# Patient Record
Sex: Male | Born: 1955 | Race: White | Hispanic: No | Marital: Married | Smoking: Never smoker
Health system: Southern US, Community
[De-identification: ages and names within clinical notes are randomized; demographics above are authoritative.]

## PROBLEM LIST (undated history)

## (undated) DIAGNOSIS — K219 Gastro-esophageal reflux disease without esophagitis: Secondary | ICD-10-CM

## (undated) DIAGNOSIS — K759 Inflammatory liver disease, unspecified: Secondary | ICD-10-CM

## (undated) HISTORY — PX: KNEE SURGERY: SHX244

## (undated) HISTORY — DX: Gastro-esophageal reflux disease without esophagitis: K21.9

## (undated) HISTORY — PX: CHOLECYSTECTOMY: SHX55

## (undated) HISTORY — DX: Inflammatory liver disease, unspecified: K75.9

## (undated) HISTORY — PX: COLONOSCOPY W/ BIOPSIES: SHX1374

---

## 1997-12-17 ENCOUNTER — Other Ambulatory Visit: Admission: RE | Admit: 1997-12-17 | Discharge: 1997-12-17 | Payer: Self-pay | Admitting: Gastroenterology

## 1999-08-21 ENCOUNTER — Emergency Department (HOSPITAL_COMMUNITY): Admission: EM | Admit: 1999-08-21 | Discharge: 1999-08-22 | Payer: Self-pay | Admitting: Emergency Medicine

## 1999-08-22 ENCOUNTER — Encounter: Payer: Self-pay | Admitting: Internal Medicine

## 1999-08-22 ENCOUNTER — Inpatient Hospital Stay (HOSPITAL_COMMUNITY): Admission: EM | Admit: 1999-08-22 | Discharge: 1999-08-24 | Payer: Self-pay | Admitting: Internal Medicine

## 2000-01-12 ENCOUNTER — Other Ambulatory Visit: Admission: RE | Admit: 2000-01-12 | Discharge: 2000-01-12 | Payer: Self-pay | Admitting: Gastroenterology

## 2000-01-12 ENCOUNTER — Encounter (INDEPENDENT_AMBULATORY_CARE_PROVIDER_SITE_OTHER): Payer: Self-pay | Admitting: Specialist

## 2004-09-20 ENCOUNTER — Observation Stay (HOSPITAL_COMMUNITY): Admission: EM | Admit: 2004-09-20 | Discharge: 2004-09-20 | Payer: Self-pay | Admitting: Emergency Medicine

## 2004-09-20 ENCOUNTER — Encounter (INDEPENDENT_AMBULATORY_CARE_PROVIDER_SITE_OTHER): Payer: Self-pay | Admitting: Specialist

## 2004-10-25 ENCOUNTER — Inpatient Hospital Stay (HOSPITAL_COMMUNITY): Admission: EM | Admit: 2004-10-25 | Discharge: 2004-10-27 | Payer: Self-pay | Admitting: Emergency Medicine

## 2004-10-25 ENCOUNTER — Ambulatory Visit: Payer: Self-pay | Admitting: Internal Medicine

## 2004-11-11 ENCOUNTER — Ambulatory Visit: Payer: Self-pay | Admitting: Gastroenterology

## 2004-12-21 ENCOUNTER — Emergency Department (HOSPITAL_COMMUNITY): Admission: EM | Admit: 2004-12-21 | Discharge: 2004-12-21 | Payer: Self-pay | Admitting: *Deleted

## 2005-01-07 ENCOUNTER — Ambulatory Visit: Payer: Self-pay | Admitting: Cardiology

## 2005-01-15 ENCOUNTER — Ambulatory Visit: Payer: Self-pay

## 2005-02-05 ENCOUNTER — Ambulatory Visit: Payer: Self-pay | Admitting: Internal Medicine

## 2005-11-07 IMAGING — CT CT PELVIS W/O CM
2 of 3 series · 17 of 46 positions shown, 19 images · non-contrast
Comparison: KUB on 10/25/04 and prior CT abdomen and pelvis on 09/20/04 and an intraoperative cholangiogram on 09/20/04.  
 ABDOMEN CT WITHOUT CONTRAST:

11/02/04 ? DUPLICATE COPY for exam association in RIS ? No change from original report.
CLINICAL DATA: 49-year-old with back and abdominal pain.  Flank pain bilaterally.  History of cholecystectomy five weeks ago.  Question of stone on KUB.
TECHNIQUE: Multidetector CT imaging of the abdomen was performed following the standard protocol without IV contrast.  
 Lung windows show no nodule, pleural effusion or infiltrate.  The patient has had cholecystectomy.  There is a stone measuring 4 mm at the region of the ampulla.  The findings are suspicious for obstructing distal common bile duct stone.  Surgical clips are seen in the gallbladder fossa, which is otherwise unremarkable.  The common bile duct is 11 mm in diameter.  
 No focal abnormality is seen within the liver, spleen, pancreas, adrenal glands or kidneys.  No retroperitoneal adenopathy or ascites.  No free intraperitoneal air or evidence for inflammation.
TECHNIQUE: Multidetector CT imaging of the pelvis was performed following the standard protocol without IV contrast. 
 The appendix has normal appearance.  Pelvic bowel loops have a normal appearance.  No pelvic adenopathy or free pelvic fluid.

[Series 2: renal stone · axial · 0.84mm/px · z∈[-500,-95]mm · 14 of 95 slices shown, 16 images]
[im 7/95  soft-tissue]
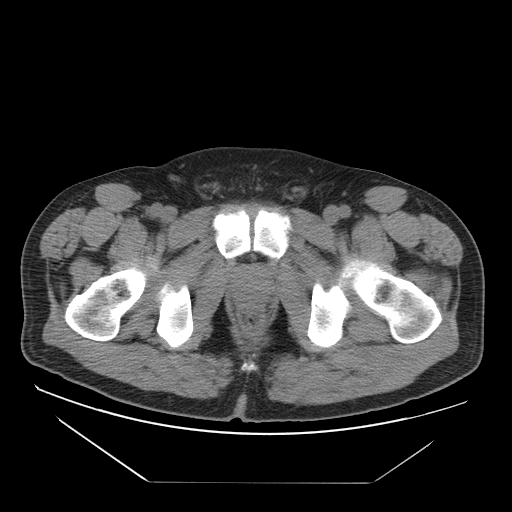
[im 7/95  bone]
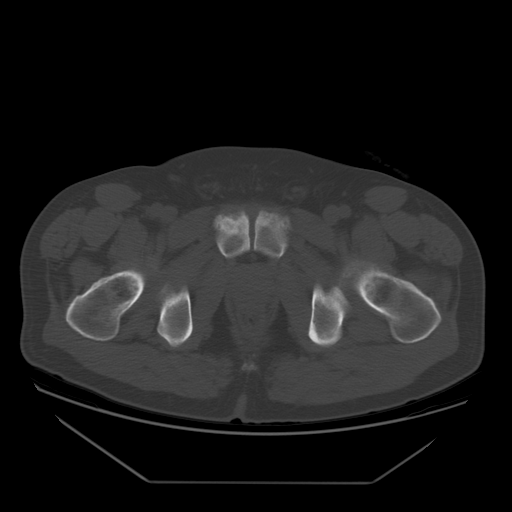
[im 13/95  soft-tissue]
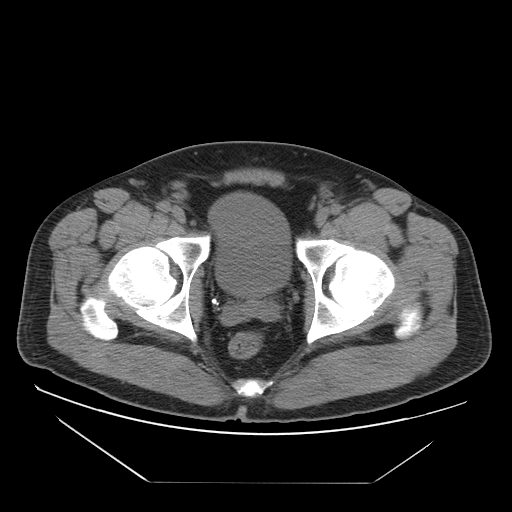
[im 19/95  soft-tissue]
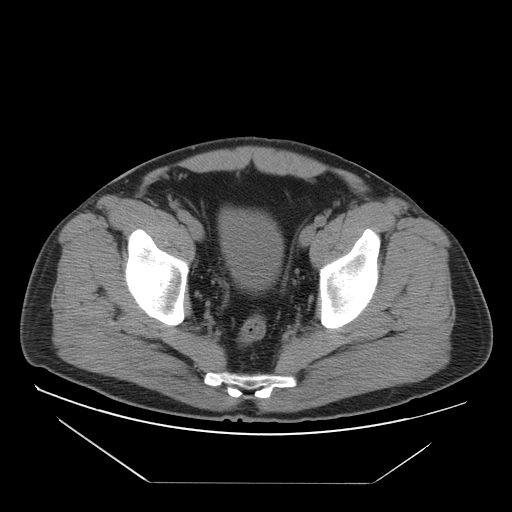
[im 25/95  soft-tissue]
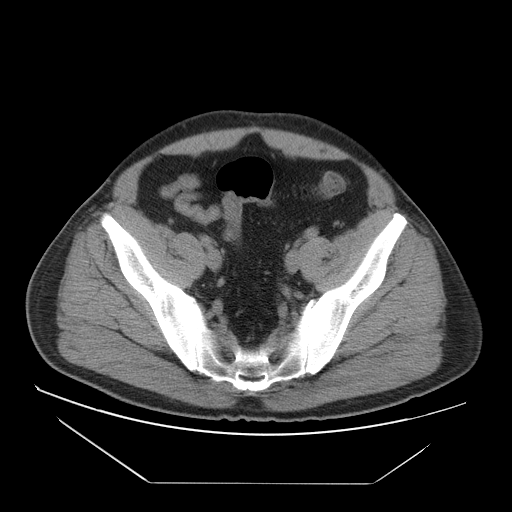
[im 31/95  soft-tissue]
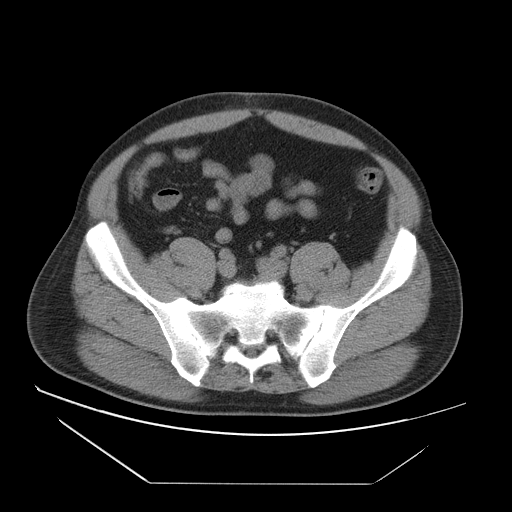
[im 37/95  soft-tissue]
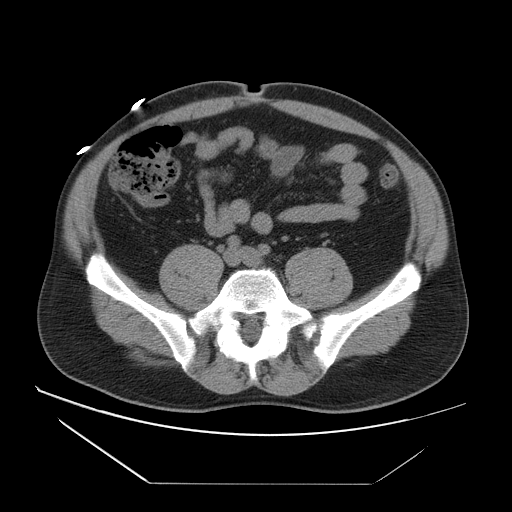
[im 43/95  soft-tissue]
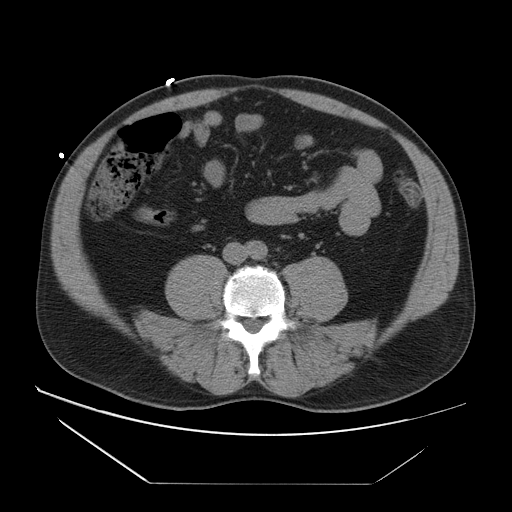
[im 52/95  soft-tissue]
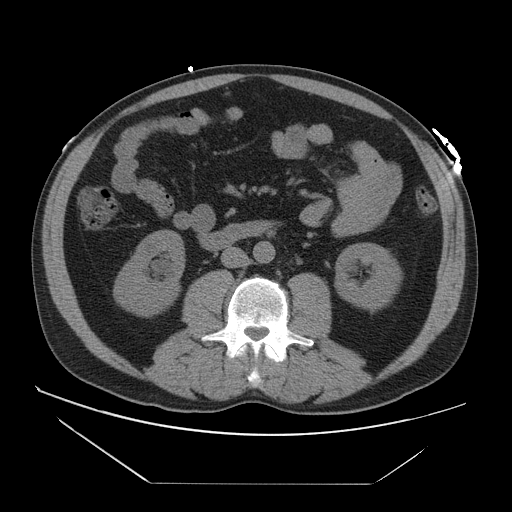
[im 58/95  soft-tissue]
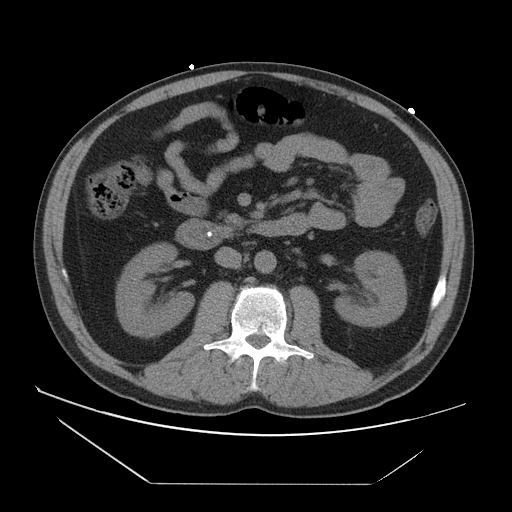
[im 58/95  bone]
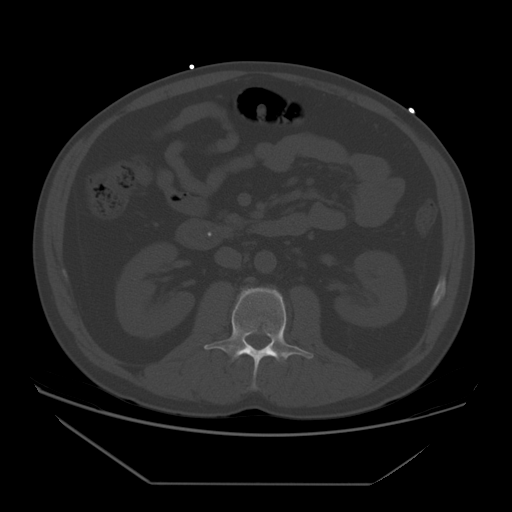
[im 64/95  soft-tissue]
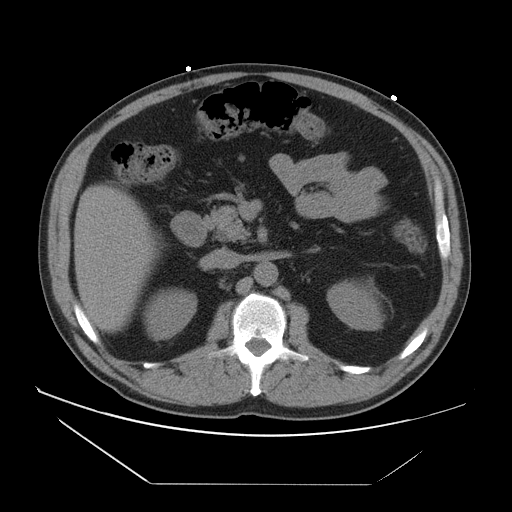
[im 70/95  soft-tissue]
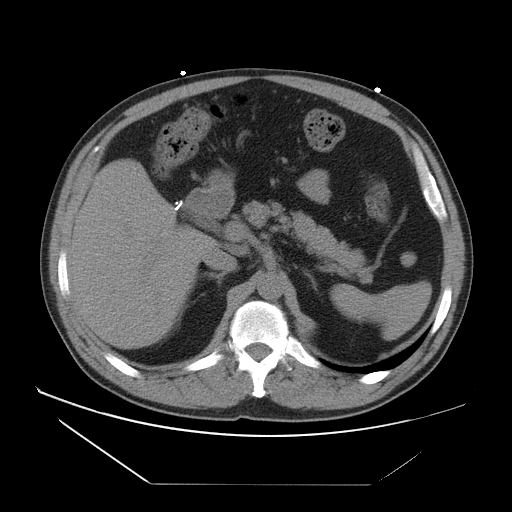
[im 76/95  soft-tissue]
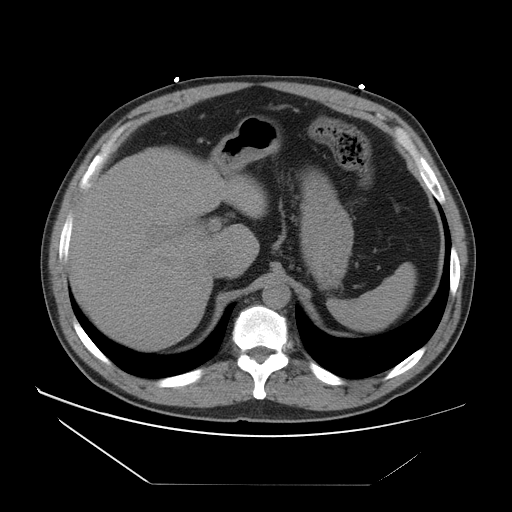
[im 82/95  soft-tissue]
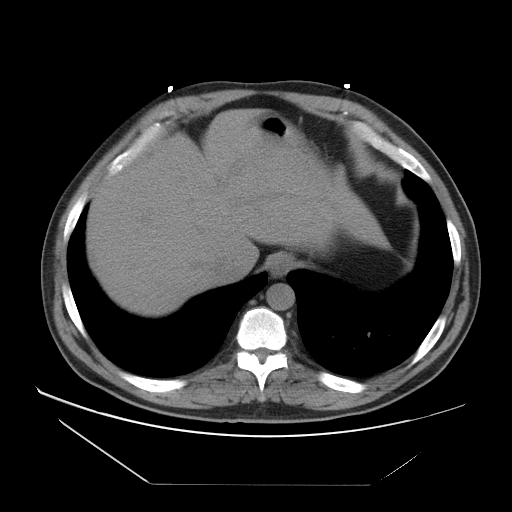
[im 88/95  soft-tissue]
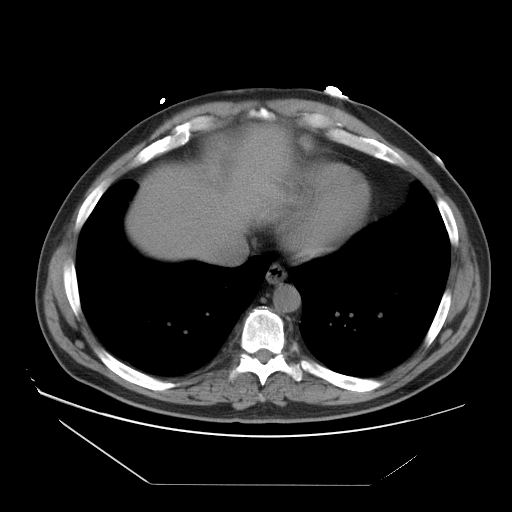

[Series 104: reformatted · sagittal · 0.94mm/px · 3 of 71 slices shown]
[im 24/71  soft-tissue]
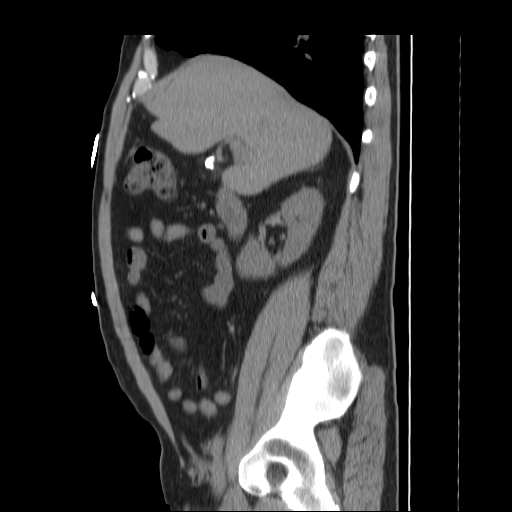
[im 32/71  soft-tissue]
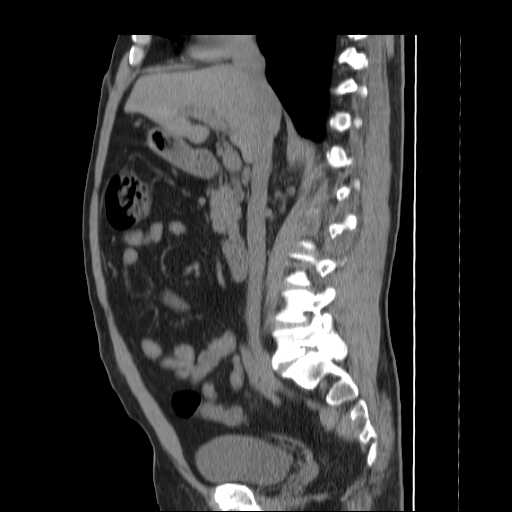
[im 39/71  soft-tissue]
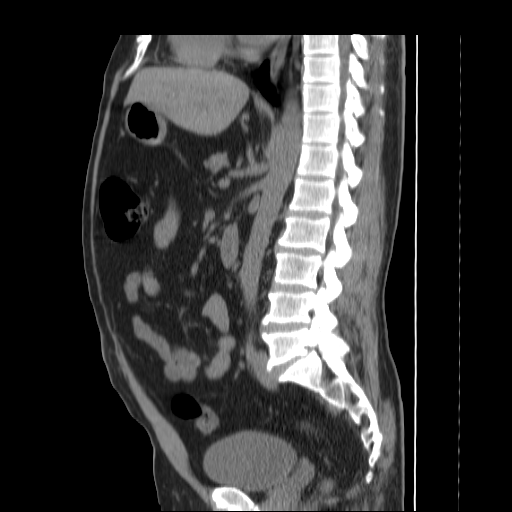

[17 of 46 positions shown; findings below may reference images not displayed]

IMPRESSION: Suspect distal common bile duct stone at the level of the ampulla.  
 PELVIS CT WITHOUT CONTRAST:
IMPRESSION: No evidence for acute pelvic abnormality.

## 2005-11-07 IMAGING — CR DG ABDOMEN 2V
3 series · 3 of 3 positions shown · non-contrast
Comparison: 09/20/2004

CLINICAL DATA: Abdominal pain, back pain

ABDOMEN - 2 VIEW

[w abdomen upright]
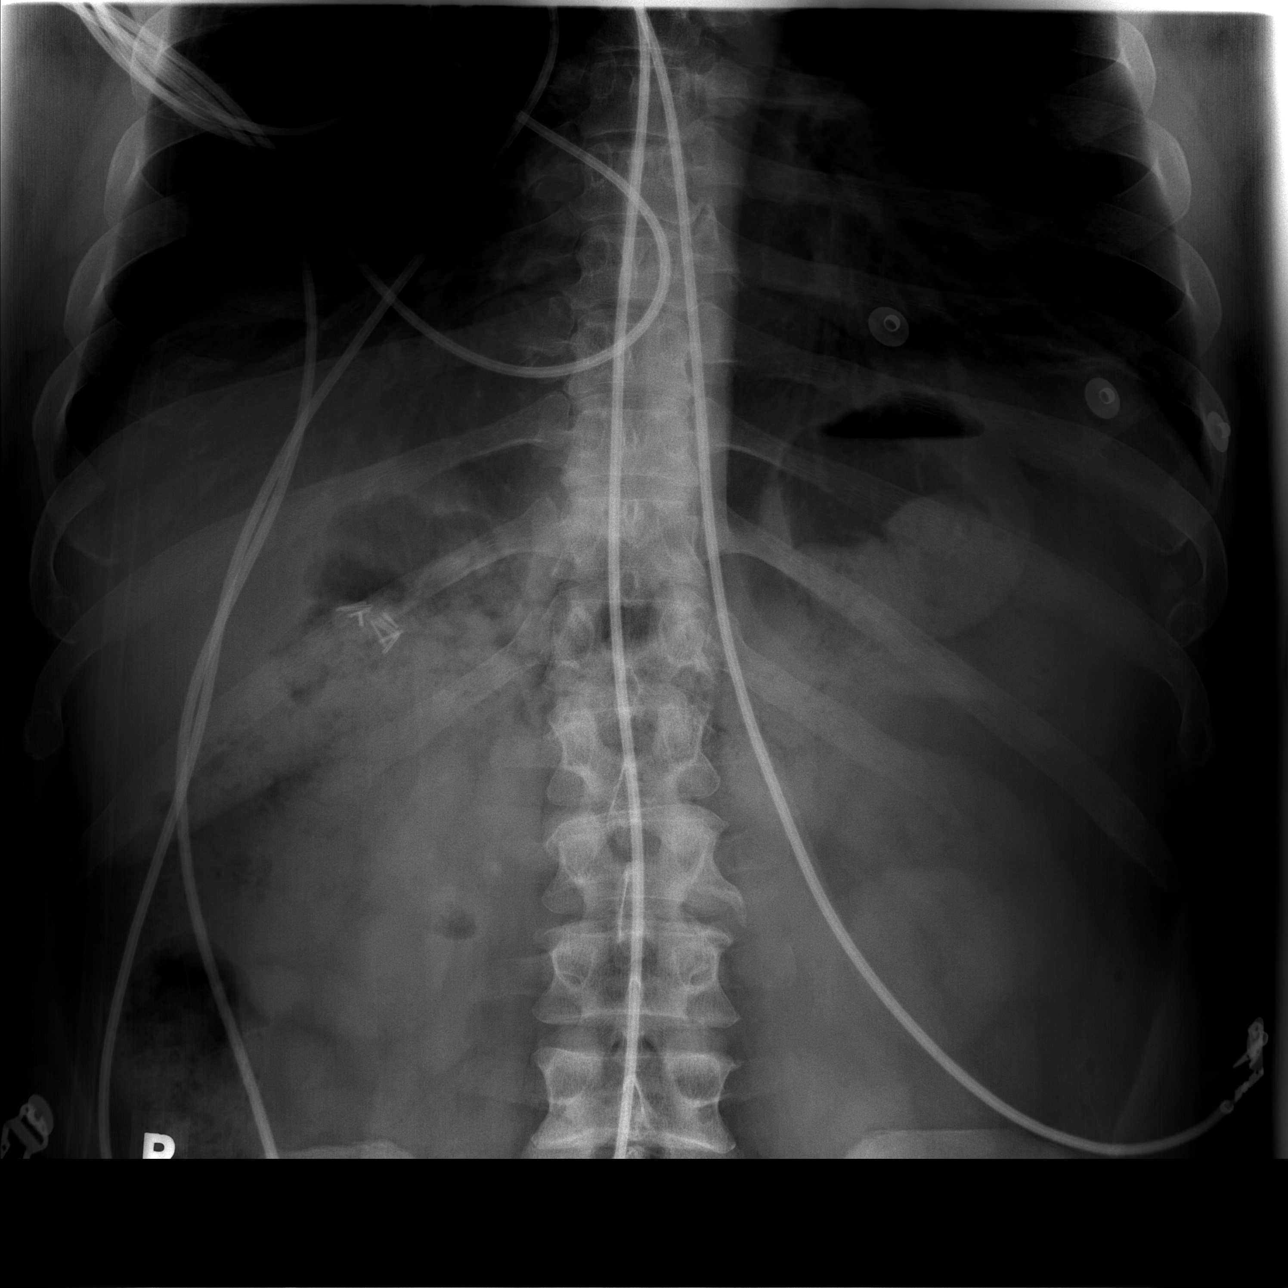

[t abdomen supine (1 of 2)]
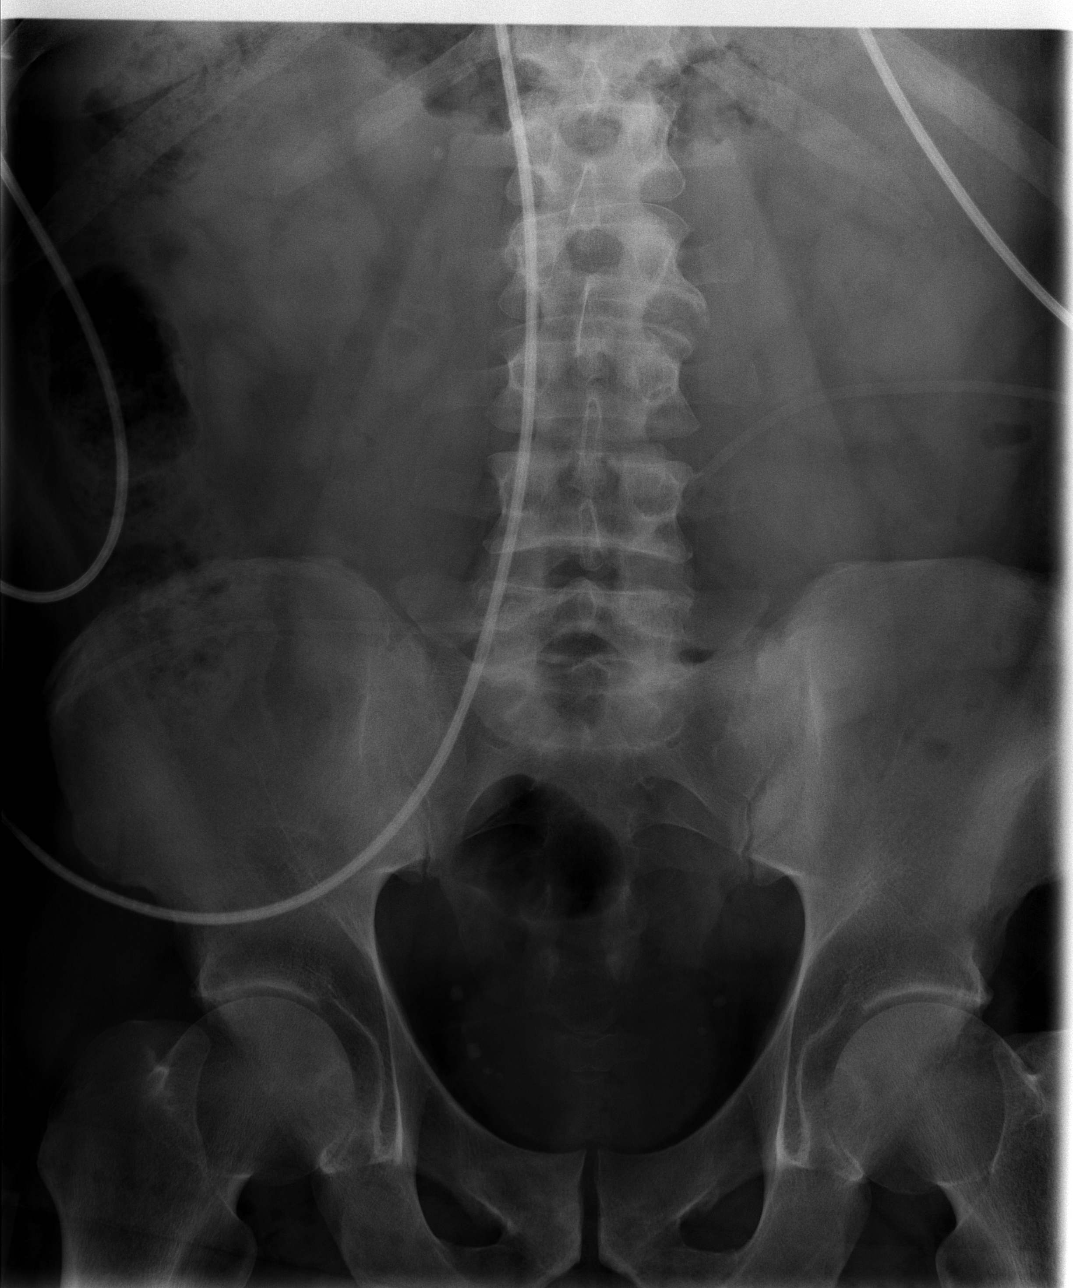

[t abdomen supine (2 of 2)]
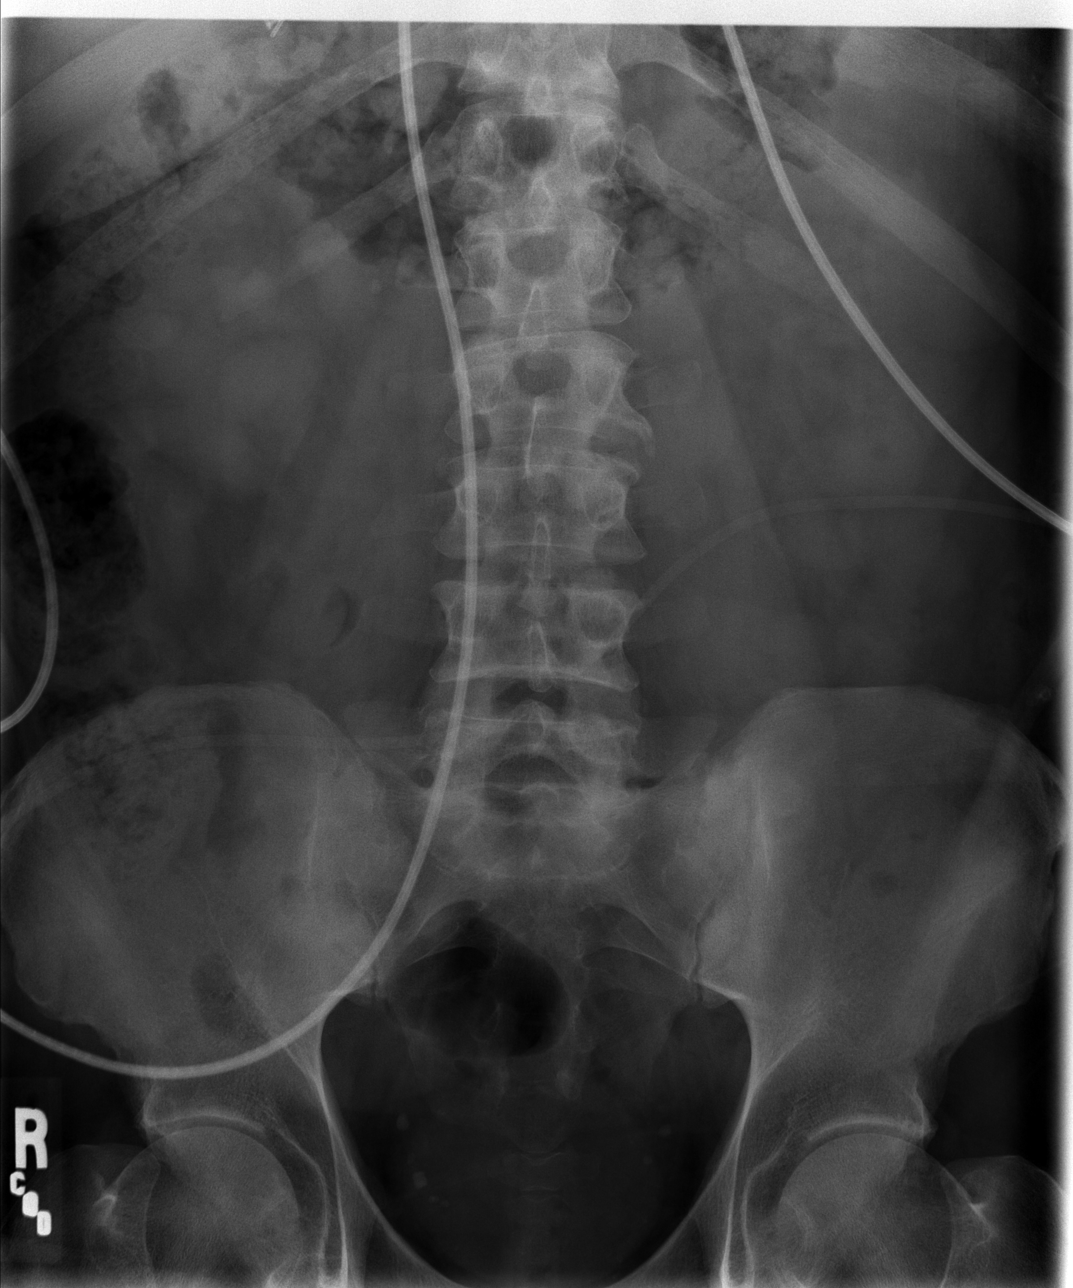

[3 of 3 positions shown; findings below may reference images not displayed]

FINDINGS: Severe constipation. Patient status post cholecystectomy. No evidence
of obstruction or free air.

There is a small calcification tracking out of the right L2 transverse process.
There is clinical concern for urinary tract stones, recommend CT.

IMPRESSION

Severe constipation. Status post cholecystectomy.

Small calcification adjacent to the right L2 transverse process. If there is
clinical concern for possible right urinary tract stone, CT may be helpful.

## 2005-11-07 IMAGING — CR DG CHEST 1V PORT
1 series · 1 of 1 positions shown · non-contrast
Comparison: None.

CLINICAL DATA: 49-year-old with back and abdominal pain. 
 PORTABLE CHEST:

[view not recorded]
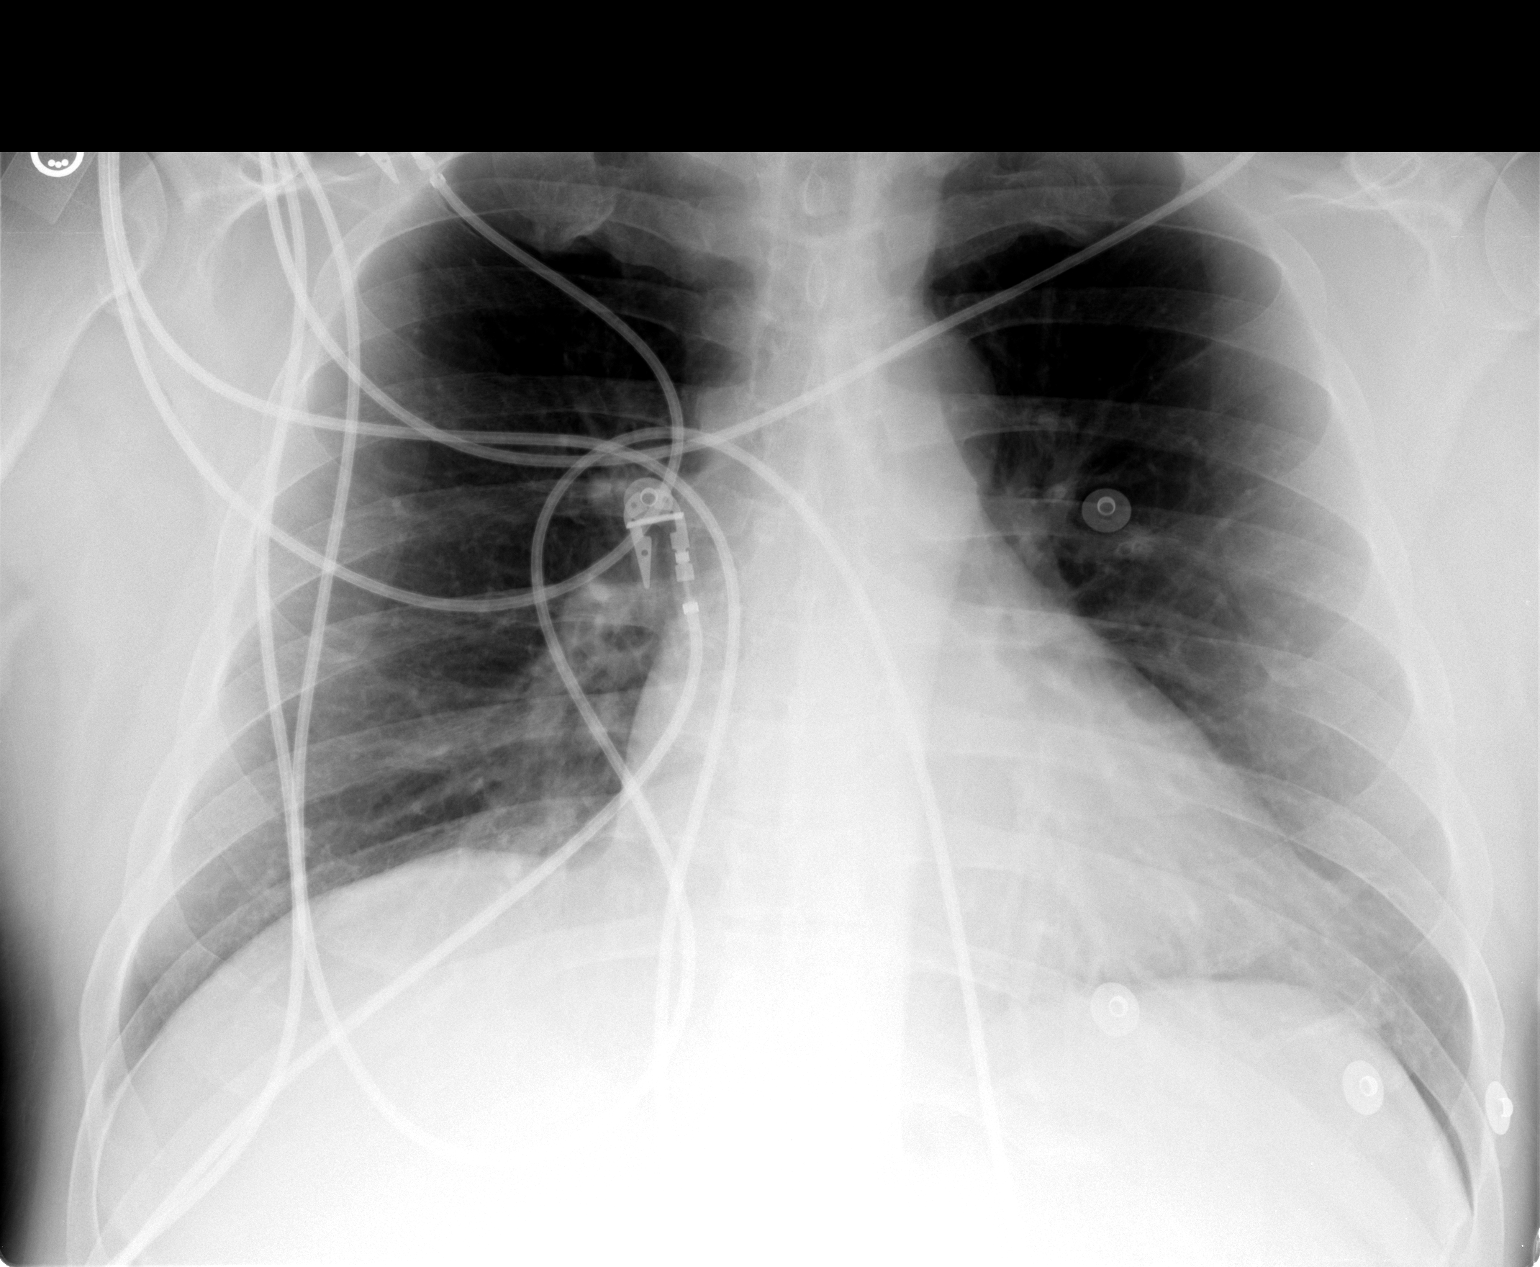

[1 of 1 positions shown; findings below may reference images not displayed]

FINDINGS: There are lines and leads overlying the chest, which could obscure subtle findings.  Heart size is within normal limits, given projection of the film.  Lungs are clear.
IMPRESSION: No acute cardiopulmonary findings.

## 2006-05-20 ENCOUNTER — Ambulatory Visit: Payer: Self-pay | Admitting: Gastroenterology

## 2006-06-02 ENCOUNTER — Encounter (INDEPENDENT_AMBULATORY_CARE_PROVIDER_SITE_OTHER): Payer: Self-pay | Admitting: Specialist

## 2006-06-02 ENCOUNTER — Encounter (INDEPENDENT_AMBULATORY_CARE_PROVIDER_SITE_OTHER): Payer: Self-pay | Admitting: *Deleted

## 2006-06-02 ENCOUNTER — Ambulatory Visit: Payer: Self-pay | Admitting: Gastroenterology

## 2006-06-20 ENCOUNTER — Ambulatory Visit: Payer: Self-pay | Admitting: Gastroenterology

## 2006-06-20 LAB — CONVERTED CEMR LAB
Alkaline Phosphatase: 62 units/L (ref 39–117)
Basophils Absolute: 0.2 10*3/uL — ABNORMAL HIGH (ref 0.0–0.1)
Basophils Relative: 2.2 % — ABNORMAL HIGH (ref 0.0–1.0)
Folate: 4.3 ng/mL
GFR calc Af Amer: 115 mL/min
HCT: 39.4 % (ref 39.0–52.0)
Monocytes Absolute: 0.8 10*3/uL — ABNORMAL HIGH (ref 0.2–0.7)
Monocytes Relative: 7.5 % (ref 3.0–11.0)
Neutrophils Relative %: 55.9 % (ref 43.0–77.0)
Platelets: 313 10*3/uL (ref 150–400)
Potassium: 4.2 meq/L (ref 3.5–5.1)
RDW: 12.9 % (ref 11.5–14.6)
Total Protein: 6.8 g/dL (ref 6.0–8.3)

## 2006-07-07 DIAGNOSIS — K5100 Ulcerative (chronic) pancolitis without complications: Secondary | ICD-10-CM

## 2007-02-22 ENCOUNTER — Ambulatory Visit: Payer: Self-pay | Admitting: Internal Medicine

## 2007-02-22 LAB — CONVERTED CEMR LAB
Basophils Relative: 3.1 % — ABNORMAL HIGH (ref 0.0–1.0)
Eosinophils Absolute: 0.4 10*3/uL (ref 0.0–0.6)
Eosinophils Relative: 3 % (ref 0.0–5.0)
Lymphocytes Relative: 22.6 % (ref 12.0–46.0)
MCHC: 35.1 g/dL (ref 30.0–36.0)
Neutrophils Relative %: 62.9 % (ref 43.0–77.0)
Platelets: 376 10*3/uL (ref 150–400)
RBC: 4.12 M/uL — ABNORMAL LOW (ref 4.22–5.81)
RDW: 13.8 % (ref 11.5–14.6)

## 2007-03-31 ENCOUNTER — Ambulatory Visit: Payer: Self-pay | Admitting: Family Medicine

## 2007-06-27 ENCOUNTER — Encounter: Payer: Self-pay | Admitting: Internal Medicine

## 2008-05-23 ENCOUNTER — Encounter (INDEPENDENT_AMBULATORY_CARE_PROVIDER_SITE_OTHER): Payer: Self-pay | Admitting: *Deleted

## 2009-01-17 ENCOUNTER — Encounter (INDEPENDENT_AMBULATORY_CARE_PROVIDER_SITE_OTHER): Payer: Self-pay | Admitting: *Deleted

## 2009-01-17 ENCOUNTER — Ambulatory Visit: Payer: Self-pay | Admitting: Internal Medicine

## 2009-01-17 DIAGNOSIS — E559 Vitamin D deficiency, unspecified: Secondary | ICD-10-CM

## 2009-01-19 LAB — CONVERTED CEMR LAB
AST: 25 units/L (ref 0–37)
Alkaline Phosphatase: 74 units/L (ref 39–117)
BUN: 20 mg/dL (ref 6–23)
Basophils Absolute: 0.1 10*3/uL (ref 0.0–0.1)
Calcium: 9.7 mg/dL (ref 8.4–10.5)
Chloride: 106 meq/L (ref 96–112)
Creatinine, Ser: 1.4 mg/dL (ref 0.4–1.5)
GFR calc non Af Amer: 56.46 mL/min (ref >60)
HCT: 44.3 % (ref 39.0–52.0)
Hemoglobin: 15 g/dL (ref 13.0–17.0)
Neutro Abs: 7 10*3/uL (ref 1.4–7.7)
Potassium: 4.3 meq/L (ref 3.5–5.1)
RBC: 4.49 M/uL (ref 4.22–5.81)
WBC: 11.1 10*3/uL — ABNORMAL HIGH (ref 4.5–10.5)

## 2009-01-20 LAB — CONVERTED CEMR LAB: Vit D, 25-Hydroxy: 15 ng/mL — ABNORMAL LOW (ref 30–89)

## 2009-03-20 ENCOUNTER — Ambulatory Visit: Payer: Self-pay | Admitting: Internal Medicine

## 2009-03-25 ENCOUNTER — Encounter: Payer: Self-pay | Admitting: Internal Medicine

## 2010-03-12 NOTE — Procedures (Signed)
Summary: Colonoscopy  Patient: [name] Note: All result statuses are Final unless otherwise noted.  Tests: (1) Colonoscopy (COL)   COL Colonoscopy           [facility] [employer]     [place], [place]  [address]           COLONOSCOPY PROCEDURE REPORT           PATIENT:  Isaiah, Miller  MR#:  [id number]     BIRTHDATE:  04-12-1955, 53 yrs. old  GENDER:  male           ENDOSCOPIST:  Gatha Mayer, MD, [facility]     Referred by:           PROCEDURE DATE:  03/20/2009     PROCEDURE:  Colonoscopy with biopsy     ASA CLASS:  Class I     INDICATIONS:  evaluation of ulcerative colitis, surveillance last     examination 2008     extent of disease not clear from that report but has been     characterized as left-sided colitis           MEDICATIONS:   Fentanyl 50 mcg IV, Versed 7 mg IV           DESCRIPTION OF PROCEDURE:   After the risks benefits and     alternatives of the procedure were thoroughly explained, informed     consent was obtained.  Digital rectal exam was performed and     revealed no abnormalities and normal prostate.   The LB CF-[id number]     [id number] endoscope was introduced through the anus and advanced to     the cecum, which was identified by both the appendix and ileocecal     valve, without limitations.  The quality of the prep was good,     using MoviPrep.  The instrument was then slowly withdrawn as the     colon was fully examined.     Insertion: 1:29 minutes Withdrawal: 13:52 minutes     <<PROCEDUREIMAGES>>           FINDINGS:  Colitis was found in the mid transverse colon. Multiple     aphthous ulcers - tiny.  This was otherwise a normal examination     of the colon. No colitis seen elsewhere in the colon. Random     biopsies were obtained and sent to pathology. Surveillance     biopsies taken and segregated to cecum-hepatic flexure, transverse     colon, splenic flexure and decsending colon, sigmoid and rectum.     Retroflexed views  in the rectum revealed no abnormalities.    The     scope was then withdrawn from the patient and the procedure     completed.           COMPLICATIONS:  None           ENDOSCOPIC IMPRESSION:     1) Colitis in the mid transverse colon     2) Otherwise normal examination - no active endoscopic colitis     elsewhere but surveillance biopsies taken throughout     RECOMMENDATIONS:     1) continue current medications     2) Await biopsy results     3) Will address low vitamin D and additional supplementation     when I review pathology           REPEAT EXAM:  In for Colonoscopy. likely in 2 years           Gatha Mayer, MD, [facility]           CC:  The Patient           n.     eSIGNED:   Gatha Mayer at 03/20/2009 09:37 AM           Dartha Lodge, [id number]  Note: An exclamation mark (!) indicates a result that was not dispersed into the flowsheet. Document Creation Date: 03/20/2009 9:38 AM _______________________________________________________________________  (1) Order result status: Final Collection or observation date-time: 03/20/2009 09:23 Requested date-time:  Receipt date-time:  Reported date-time:  Referring Physician:   Ordering Physician: Silvano Rusk [phone]) Specimen Source:  Source: Tawanna Cooler Order Number: [id number] Lab site:   Appended Document: Colonoscopy     Procedures Next Due Date:    Colonoscopy: 03/2011

## 2010-03-12 NOTE — Letter (Signed)
Summary: Patient Notice- Colon Biospy Results  [facility]  [address] [place], [place] [address]   Phone: [phone]  Fax: [phone]        March 25, 2009 MRN: [id number]    Gabrial Khiev [address] [place], [place]  [address]    Dear Mr. Mccort,  I am pleased to inform you that the biopsies taken during your recent colonoscopy did not show any evidence of cancer or pre-cancerous changes known as dysplasia upon pathologic examination. there were some areas of inflammation (colitis) but overall, your colitis is under good control.  Continue with the treatment plan (your current medications)as outlined on the day of your exam.  You should have a repeat colonoscopy examination for this problem           in 2 years.  You will need a routine  office visit within 1 year.  Please call us if you are having persistent problems or have questions about your condition that have not been fully answered at this time.  Sincerely,  Gatha Mayer MD, [facility]   This letter has been electronically signed by your physician.  Appended Document: Patient Notice- Colon Biospy Results Letter mailed 2.18.11

## 2010-06-03 ENCOUNTER — Other Ambulatory Visit: Payer: Self-pay | Admitting: Internal Medicine

## 2010-06-23 NOTE — Assessment & Plan Note (Signed)
[facility] OFFICE NOTE   CHARBEL, LOS                   MRN:          [id number]  DATE:06/20/2006                            DOB:          July 30, 1955    Nechemia comes in after his colonoscopic examination which revealed mild  to moderate colitis involving his left and transverse colon areas.  Right colon was not affected visually or by biopsy.  I had a long talk  with Kahlin about his compliance with the sulfasalazine.  He said he  gets it for $5 and therefore, he had stopped the Lialda which would be a  lot more expensive ultimately.  I certainly do understand that.  He says  he only takes the sulfasalazine some times just four pills a day and I  told him that I thought it was important that he keep his inflammatory  process under as much control as possible because I think the incidence  of cancer arises in patient's who have chronic inflammation in their  colons.  I think it is better to keep it as quiescent as possible.  He  understands this.  I do not know if he will comply.  I told him to  follow up with one of my colleagues in my absence and I did order some  routine labs on him.  I ordered some routine labs since we checked and  it had not been ordered in the recent past.  I also advised him to make  sure he uses multivitamins, folate and some B12 because sometimes the  sulfasalazine counteracts this.     Clarene Reamer, MD  Electronically Signed    SML/MedQ  DD: 06/20/2006  DT: 06/21/2006  Job #: [id number]   cc:   Kathlene November, MD

## 2010-06-23 NOTE — Assessment & Plan Note (Signed)
[facility]                         GASTROENTEROLOGY OFFICE NOTE   Isaiah, Miller                   MRN:          [id number]  DATE:02/22/2007                            DOB:          Mar 12, 1955    CHIEF COMPLAINT:  Ulcerative colitis flare.   HISTORY:  Isaiah Miller is a 55 year old white man with ulcerative colitis,  previously followed by Dr. Velora Heckler.  His last colonoscopy is April 2008,  showing left-sided ulcerative colitis.  He has been maintained on  sulfasalazine, but admits he does not take it as directed frequently.  Recently, he had some bloating, gas, loose bowel movements, some crampy  abdominal pain.  He had some prednisone left over, started at 30 a day  and tapered himself down to 10 mg a day, over 7 to 8 days and felt  better, but he is now out of prednisone.  His bowel movements are on the  way back to normal.  There has been no bleeding, though there has been  some mucus.  He occasionally uses Advil, but he says not lately.  He was  diagnosed with ulcerative colitis in 1988.   MEDICATIONS:  At this time, are sulfasalazine.  He is supposed to be on  500 mg, four twice a day he tells me, though three twice a day is what  is listed.  He says he often takes it four a day but not twice a day.  He uses Zantac and Advil p.r.n. as described.   ALLERGIES:  THERE ARE NO KNOWN DRUG ALLERGIES.   PAST MEDICAL HISTORY:  1. Ulcerative colitis, left-sided.  Biopsy showed that in the left      side he had mild disease in the left side at endoscopic evaluation      April 2008.  Last lab testing May 2008:  Normal CBC and CMET.      Vitamin B12 level, folate were normal at that time as well.  He has      been on Lialda, but it was too expensive.  He has taken prednisone      intermittently over the years, but I am not sure if he ever took it      for a prolonged period of time.  2. Previous endoscopic retrograde cholangiopancreatography with  stone      extraction 2006 (Dr. Henrene Pastor).  3. Remote history of hepatitis.  4. Prior cholecystectomy.  5. History of gastroesophageal reflux disease.   SOCIAL HISTORY:  He is a Building control surveyor.  He drinks a 6-pack of beer on the  weekends, no smoking, married with three children.   FAMILY HISTORY:  Coronary artery disease, no ulcerative colitis.   PHYSICAL EXAMINATION:  GENERAL:  Reveals a robust and obese middle-aged  white man.  VITAL SIGNS:  223 pounds, pulse 92, blood pressure 130/74.  HEENT:  Eyes anicteric.  LUNGS:  Clear.  HEART:  S1-S2.  No murmurs or gallops.  ABDOMEN:  Obese, soft, nontender.  No organomegaly or mass.  RECTAL:  Shows no perianal changes.  PSYCHE:  Alert and oriented x3.   ASSESSMENT:  Left-sided ulcerative colitis with non-compliance  of  medical therapy, intermittent prednisone over the years.   PLAN:  1. Check CBC, sed rate, vitamin D level.  2. Dexa-scan given the inflammatory bowel disease and chronic      intermittent use of prednisone.  3. Will go ahead, since he is not completely resolved, and taper his      prednisone 10 mg a day for 7 days, 5 mg a day for 7 days, and then      off.  4. We looked at the possibility of Asacol and other ASA medications.      It sounds like, if he takes the sulfasalazine, he can stay in      control when he does not seem to have any allergies or too much of      a sensitivity to that.  Going to go back on sulfasalazine 4 grams a      day.  He will take 4 tablets b.i.d (500 mg).  5. Follow up with me, 6 months or sooner.  I will notify him of the      lab and the DEXA results.  6. We had an extensive discussion about the risks of chronic      prednisone, including osteoporosis, aseptic necrosis of the hips      and how he should try to avoid that.  He understands there are      other medications, other than ASA compounds that could be      necessary.  Perhaps, enema therapy might be good for this man,      though we did  not discuss that today.  His endoscopic disease does      not look that significant, though that does not always correlate      symptoms and signs.   NOTE:  His CBC is back.  His white count was 12.1, his hemoglobin was  normal, his sed rate was 18.  Vitamin D level is pending.     Gatha Mayer, MD,FACG  Electronically Signed    CEG/MedQ  DD: 02/25/2007  DT: 02/25/2007  Job #: [id number]

## 2010-06-26 NOTE — Op Note (Signed)
NAME:  Isaiah Miller, Isaiah Miller                ACCOUNT NO.:  [id number]   MEDICAL RECORD NO.:  [id number]          PATIENT TYPE:  INP   LOCATION:  3031                         FACILITY:  [facility]   PHYSICIAN:  Docia Chuck. Henrene Pastor, M.D. [facility] OF BIRTH:  06/22/1955   DATE OF PROCEDURE:  10/26/2004  DATE OF DISCHARGE:                                 OPERATIVE REPORT   PROCEDURE:  Endoscopic retrograde cholangiography with biliary  sphincterotomy and common bile duct stone extraction.   INDICATIONS:  Symptomatic choledocholithiasis.   HISTORY:  This is a 55 year old white male with a history of quiescent  ulcerative colitis, for which Isaiah Miller was followed by Clarene Reamer, M.D.  Isaiah Miller  was admitted to the hospital yesterday by Gatha Mayer, M.D., with severe  epigastric pain that was radiating into the back.  Isaiah Miller ended up undergoing a  CT urogram, which revealed a 4 mm isodense stone in the distal common bile  duct.  Isaiah Miller is status post laparoscopic cholecystectomy September 20, 2004, with  Dr. Rise Patience.  At that time a 4-5 mm cystic duct stone was suspected on CT  but apparently not found at cholecystectomy.  Isaiah Miller is feeling better  overnight.  His transaminases, however, went from minimally abnormal on  admission to significantly abnormal this morning with AST 736, ALT 623,  alkaline phosphatase 136, total bilirubin 2.1.  Isaiah Miller is for ERCP with probable  sphincterotomy and stone extraction.  The nature of this procedure as well  as its risks, benefits, and alternatives were discussed in detail by myself  with the patient.  Corresponding consent signed as Isaiah Miller understood and agreed  to proceed.   PROCEDURE:  After informed consent was obtained, the patient was sedated  through the course of the procedure with 125 mg of Demerol and 12.5 mg of  Versed IV.  The patient is a rather large fellow with a thick neck.  Isaiah Miller was  a bit agitated and upon receiving the supplemental sedation had some  problems with desaturation  due to obstruction of the posterior pharynx.  Thus, a nasal trumpet cannula was placed with excellent saturations  immediately demonstrated.  The patient remained cooperative and saturations  remained excellent throughout the course of the case.  The Olympus side-  viewing endoscope was then passed blindly into the esophagus.  Stomach,  duodenal bulb and postbulbar duodenum were normal.  The major ampulla was  bulbous and elongated.  The minor papilla was not sought.   X-RAY FINDINGS:  1.  Spot radiograph of the abdomen with the endoscope in position reveals      previous cholecystectomy with clips.  2.  The common bile duct was selectively and deeply cannulated.  Injection      of contrast yielded a mildly dilated biliary system.  A filling defect      was obvious.  Subsequently a moderate-sized biliary sphincterotomy was      made with cutting via the ERBE system over a hydrophilic wire.  Cutting      was in the 12 o'clock orientation.  The distal stone was extracted with  the 12.5 mm balloon.  The stone was pigmented.  Subsequent injection of      contrast throughout the biliary tree demonstrated an additional      approximately 5 mm filling defect consistent with a second common duct      stone.  This was pulled to the distal bile duct.  Attempts to remove it      with balloon and basket were unsuccessful.  It was evident that the      ampullary cut was inadequate to allow passage of the balloon.  Thus, the      sphincterotomy was extended with additional cutting in the 12 o'clock      orientation.  Subsequently the stone was extracted with the balloon.      Thereafter, the occlusion cholangiogram demonstrated no residual filling      defects with excellent drainage.   IMPRESSION:  1.  Choledocholithiasis, status post endoscopic retrograde cholangiography      with biliary and extraction of two common duct stones.  2.  Status post laparoscopic cholecystectomy September 20, 2004.   3.  History of ulcerative colitis, quiescent.   RECOMMENDATIONS:  1.  Continue antibiotics.  2.  Observe for postprocedure problems.  3.  Anticipated discharge in a.m. if doing well.           ______________________________  Docia Chuck. Henrene Pastor, M.D. [facility]     JNP/MEDQ  D:  10/26/2004  T:  10/26/2004  Job:  [id number]   cc:   Orson Ape. Rise Patience, M.D.  [address], [address]  [place]  [place] [address]   Clarene Reamer, M.D. [facility]   Gatha Mayer, M.D. [place]  [place], [place] [address]

## 2010-06-26 NOTE — H&P (Signed)
[facility]  Patient:    KENO, CARAWAY                       MRN: [id number] Adm. Date:  [id number] Attending:  Vira Agar Dictator:   Vena Rua, P.A.                         History and Physical  DATE OF BIRTH:  Feb 13, 2055  CHIEF COMPLAINT:  Cramping abdominal pain located in the right upper quadrant radiating to the back associated with nausea.  HISTORY OF PRESENT ILLNESS:  Mr. Windhorst is a 55 year old white gentleman who is a patient of Dr. Lyla Son who follows him for history of ulcerative colitis.  Maintenance GI drugs include sulfasalazine at 1.5 g b.i.d. and p.r.n. Zantac.  He presented to the emergency room on 08/22/99 with about a 12-hour history of cramping abdominal pain worse in the right upper quadrant and radiating into the back.  This was associated with nausea but no vomiting. He denied chills, fevers, or sweats.  Bowel habits have not changed in any way and there was no rectal bleeding.  No urinary symptoms.  Labs were obtained and showed elevated liver function tests as outlined below.  The KUB showed some kidney stones.  Abdominal ultrasound showed normal gallbladder with kidney stones and his urinalysis was within normal limits.  In the emergency room, he received IM Demerol and Phenergan and felt better.  He does relate the fact that in the past the [facility] has told him that his liver function tests were elevated; however, he has not ever had symptoms similar to this. He was admitted by Dr. Henrene Pastor for further evaluation to include hepatitis serologies and various tests to work up possible hepatitis.  Possible studies include HIDA scan and ERCP.  PAST MEDICAL HISTORY:  Ulcerative colitis.  Occasional gastroesophageal reflux disease.  PAST SURGICAL HISTORY:  Status post left knee surgery.  ALLERGIES:  No known drug allergies.  MEDICATIONS ON ARRIVAL: 1. Sulfasalazine 1.5 g p.o. b.i.d. 2. Zantac as  needed.  FAMILY HISTORY:  Noncontributory.  There is no family history of gallbladder disease, ulcerative colitis.  SOCIAL HISTORY:  Patient is employed as a Building control surveyor.  He is married and has two children.  He does not smoke.  He drinks about 20-24 beers in a given week.  REVIEW OF SYSTEMS:  Pulmonary: No cough, no shortness of breath. Cardiovascular: No chest pain, no palpitations.  GI: As above.  Genitourinary: No dysuria, no frequency.  Musculoskeletal: Occasional multiarticular pain in the spine and knees.  PHYSICAL EXAMINATION:  VITAL SIGNS:  Blood pressure is 99/68, pulse is 62, respirations 22, and temperature is 96.9.  GENERAL:  Patient is alert and oriented x 3.  He looks mildly ill and is mildly sedated but able to give answers to questions.  HEENT:  Sclerae are nonicteric, conjunctivae pink, PERRL.  Oral mucosa is clear and moist.  LUNGS:  Clear to auscultation and percussion bilaterally.  CORE:  There is regular rate and rhythm without murmurs, rubs, or gallops.  ABDOMEN:  Soft with mild right upper quadrant tenderness to palpation.  Bowel sounds are active.  Belly is nondistended.  There is no hepatosplenomegaly or masses.  EXTREMITIES:  Without edema.  NEUROLOGIC:  Grossly intact.  There is no tremor.  Patient is not confused.  DERMATOLOGIC:  No jaundice.  LABORATORIES:  Hemoglobin of 14.7, white blood  cell count 9.8.  Bilirubin is 1.5, alkaline phosphatase is 101, AST is 384, and ALT is 222.  Albumin is 4.3. Potassium slightly high at 5.1, glucose elevated at 134.  BUN is 17 and creatinine is 1.1.  Amylase is 48, lipase is 35.  Urinalysis is negative with no evidence for blood, nitrites, leukocytes, or protein.  Ultrasound shows normal gallbladder with no gallstones, suspicion of bilateral renal calculi.  Acute abdominal series with chest film shows unremarkable bowel gas pattern, question right renal calculus, also questionable small bilateral renal  calculi.  CT of the abdomen shows no abnormality.  Unable to determine if he has renal calculi as he had had injection of IV contrast.  CT of the pelvis shows no adenopathy, no ureteral obstruction.  There are sigmoid diverticuli noted in the colon without inflammatory changes.  IMPRESSION: 1. Right upper quadrant pain with elevated liver tests and unremarkable    ultrasound.  Question hepatitis viral versus alcoholic.  Also need to    consider primary sclerosing cholangitis given patients history of    ulcerative colitis; however, without fever or leukocytosis, the likelihood    of sclerosing cholangitis is less likely. 2. Ulcerative colitis, quiescent. 3. History of intermittent gastroesophageal reflux disease.  PLAN:  Patient is admitted to Dr. Kennith Maes service for IV fluids.  Follow up on ordered hepatitis serologies, coags, antinuclear antibodies, and aldolase.  IV pain and emesis control.  Patient may require HIDA scan if the pain worsens.  General surgery is to see patient and follow.  Patient may also need to undergo ERCP. DD:  08/23/99 TD:  08/23/99 Job: [id number]

## 2010-06-26 NOTE — H&P (Signed)
NAME:  Isaiah Miller, Isaiah Miller                ACCOUNT NO.:  [id number]   MEDICAL RECORD NO.:  [id number]          PATIENT TYPE:  INP   LOCATION:  3031                         FACILITY:  [place]   PHYSICIAN:  Gatha Mayer, M.D. LHCDATE OF BIRTH:  05/30/1955   DATE OF ADMISSION:  10/25/2004  DATE OF DISCHARGE:                                HISTORY & PHYSICAL   CHIEF COMPLAINT:  Epigastric pain radiating to the back.   HISTORY OF PRESENT ILLNESS:  This is a 55 year old white man that was  playing golf and then ate a tuna sandwich about an hour later. He developed  severe epigastric pain radiating into the back, associated with some nausea  and diaphoresis. He presented to the emergency department where he was  evaluated. Plane films of the abdomen demonstrated a small calcification  adjacent to the right LP transverse process. CT urogram was performed which  demonstrated a 4 mm radiodense stone in the distal common bile duct about 11  mm in size. He is status post laparoscopic cholecystectomy August 13th by  Dr. Orson Ape. [place]. At that time, he had a 4 to 5 mm cystic duct stone  suspected on CT but this was not found at the cholecystectomy it seems.  Intraoperative cholangiogram was negative. He did well in the interim until  today as above. He has chronic ulcerative colitis followed by Dr. Clarene Reamer with no symptoms. He is noncompliant with Azulfidine 1500 mg b.i.d.,  generally taking 3 tablets a day. He is on no other medications.   ALLERGIES:  None known.   PAST MEDICAL HISTORY:  Ulcerative colitis. He was admitted with hepatitis,  probably alcohol induced in 2001.   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   REVIEW OF SYSTEMS:  All other systems are negative at this time.   FAMILY HISTORY:  Noncontributory.   PHYSICAL EXAMINATION:  GENERAL:  Obese, pleasant, well-developed white male  in no acute distress.  VITAL SIGNS:   Temperature 97.8, blood pressure 150/70, pulse 84,  respirations 20. He is afebrile. Temperature is 97.8.  HEENT:  Anicteric. Mouth/posterior pharynx:  No lesions.  NECK:  Supple. No thyromegaly.  CHEST:  Clear.  HEART:  S1 and S2. No gallops.  ABDOMEN:  Soft. Mild epigastric tenderness. Bowel sounds are present. He has  no edema in the extremities. No cervical or supraclavicular adenopathy.  SKIN:  No rash.  NEUROLOGICAL:  He is alert and oriented x3.   LABORATORY DATA:  CMET normal except for glucose of 117. His AST is 45.  Lipase is normal. CBC normal except for white count 11.2. His d-dimer is  positive and his CK-MBs are negative as well as troponin. He had normal  LFT's prior to his symptoms previously, i.e. when he had his laparoscopic  cholecystectomy.   I personally reviewed the CT scan. The patient does feel better in the  emergency department after having some Dilaudid.   ASSESSMENT:  1.  Symptomatic choledocholithiasis.  2.  Chronic ulcerative colitis.  3.  Alcohol overuse.  4.  Mild hyperglycemia, question prediabetes.   RECOMMENDATIONS:  1.  Admit to the hospital, hydrate, and cover with IV Unasyn.  2.  Plan for ERCP with sphincterotomy and stone extraction tomorrow. Risks,      benefits, and indications are explained to the patient who understands      and agrees to proceed.   It would be prudent for this patient to obtain a regular primary care  physician as well.      Gatha Mayer, M.D. [facility]  Electronically Signed     CEG/MEDQ  D:  10/25/2004  T:  10/26/2004  Job:  [id number]   cc:   Clarene Reamer, M.D. [facility]  [address] [address]   William J. Rise Patience, M.D.  Fax: [phone]

## 2010-06-26 NOTE — Discharge Summary (Signed)
[facility]  Patient:    Isaiah Miller, Isaiah Miller                       MRN: [id number] Adm. Date:  [id number] Disc. Date: [id number] Attending:  Neita Garnet Dictator:   Nicoletta Ba, P.A.-C.                           Discharge Summary  ADMITTING DIAGNOSES: 28. A 55 year old white male with acute right upper quadrant abdominal pain and    abnormal liver function studies with nausea, picture most consistent with    acute hepatitis viral versus alcohol-induced, cannot rule out primary    sclerosing cholangitis. 2. Ulcerative colitis, currently quiescent. 3. Chronic gastroesophageal reflux disease.  DISCHARGE DIAGNOSES: 1. Acute hepatis, stable, serologies pending. 2. Other diagnoses as listed above.  CONSULTATIONS:  None.  PROCEDURES:  Upper abdominal ultrasound.  CT scan of the abdomen and pelvis. Abdominal films.  BRIEF HISTORY:  Isaiah Miller is a pleasant 55 year old white male, known to Dr. Lyla Son, who has a history of ulcerative colitis x 11 years.  At this time he presented to the emergency room twice in the previous 12 hours with complaints of abdominal pain primarily in the right upper quadrant radiating into his back associated with nausea.  He had not had any vomiting, fever, chills, diarrhea, melena, etc. or no urinary complaints.  Apparently on the initial evaluation he did not have any work-up this morning.  Labs revealed elevated liver function studies and KUB was consistent with kidney stones. Abdominal ultrasound was also done showing a normal gallbladder and evidence of kidney stones.  Liver also felt to be normal.  He did have significantly elevated LFTs with an SGOT of 384, SGPT of 222, alk. phos. of 101 and total bilirubin of 1.5.  He was admitted to the hospital with probable acute hepatitis viral versus ETOH for further diagnostic work-up, hydration, antiemetics, etc.  LABORATORY STUDIES:  On admission, WBC of 9.8,  hemoglobin of 14.7, hematocrit of 44.4, MCV of 94, platelets of 312.  Followup on July 15 showed a WBC of 5.3, hemoglobin 14.3, hematocrit of 44.0, platelets of 306.  Pro time of 13.9, INR of 1.2, PTT of 30.  Electrolytes:  Sodium 137, potassium 5.1, BUN 17, creatinine 1.1, albumin 4.3.  SGOT of 384, SGPT of 222, alk. phos. of 101 and total bilirubin 1.5.  Amylase 48, lipase 35.  Serial values were obtained on July 15:  SGOT was 494, SGPT of 614 and alk. phos. of 129.  Total bilirubin 2.8.  On July 16, SGOT of 304, SGPT of 545, alk. phos. of 162 and total bilirubin of 1.8.  CK was elevated at 221 and acute hepatitis serologies for hepatitis B and hepatitis A were both negative.  Hepatitis C was also negative.  Urinalysis negative.  Aldolase level was also elevated at 19.8. ANA negative.  X-RAY STUDIES:  Upper abdominal ultrasound revealed evidence of normal gallbladder, normal common bile duct, 3 to 4 mm.  Liver, pancreas and spleen all felt to be normal.  He did have bilateral renal calculi.  No evidence of hydronephrosis.  The abdomen showed a normal bowel gas pattern, question right renal calculus, question small bilateral renal calculi.  CT scan of the abdomen and pelvis was no abnormality.  He did have diverticulosis noted.  HOSPITAL COURSE:  The patient was admitted to the service of Dr. Jenny Reichmann  Henrene Pastor, who is covering the hospital.  He was placed on a clear liquid diet, started on IV fluids at 125 cc/h, given Demerol and Phenergan as needed, continued on his Azulfidine.  Acute hepatitis serologies were ordered.  He underwent CT scan of the abdomen and pelvis and abdominal ultrasound on the day of admission with findings as outlined above.  The patient really had a benign hospital course, remained afebrile, improved with hydration and antiemetics. We were able to advance his diet without difficulty.  His picture was felt to be most consistent with an acute hepatitis, viral versus  possibly alcohol with a history of regular alcohol intake.  His hepatitis serologies returned negative.  By July 16 he had had no further pain, was tolerating p.o.s, was afebrile and his LFTs were starting to improve.  He was allowed discharge to home with instructions to follow up as an outpatient with Dr. Lyla Son in two to three weeks.  He was to have his liver function studies checked within the next two weeks and to call if any problems in the interim with recurrent pain, nausea, vomiting, fever, etc.  He was to follow a low fat diet, also advised decreased ETOH intake.  DISCHARGE MEDICATIONS:  Same as on admission with sulfasalazine 1.5 g b.i.d. and Zantac on a p.r.n. basis. DD:  09/18/99 TD:  09/20/99 Job: [id number]

## 2010-06-26 NOTE — H&P (Signed)
NAME:  HOOKSDamacio, Weisgerber                ACCOUNT NO.:  [id number]   MEDICAL RECORD NO.:  [id number]          PATIENT TYPE:  EMS   LOCATION:  ED                           [facility]ACILITY:  [facility]   PHYSICIAN:  Cyril Mourning, D.O.    DATE OF BIRTH:  06/28/1955   DATE OF ADMISSION:  12/21/2004  DATE OF DISCHARGE:                                HISTORY & PHYSICAL   PRIMARY CARE PHYSICIAN:  Unassigned.   CHIEF COMPLAINT:  Chest pain.   HISTORY OF PRESENT ILLNESS:  Mr. Aldape is a pleasant 56 year old Caucasian  male with a history of well-controlled ulcerative colitis, who was recently  in the hospital for choledocholithiasis, status post cholecystectomy.  He  did undergo an ERCP at that time with stone extraction and recovered well.   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  No strenuous activity.  He  developed some mid sternal, sharp chest pain, stating that he could not  catch a deep breath; however, this lasted only 5-10 seconds and was  intermittent every 10-15 minutes on a pain scale of about 4/10.  He  implemented no therapies.  Presented to the emergency department.  He was  seen by Dr. Varney Daily, who requested evaluation and admission to rule out  MI.   He does have a significant family history for early coronary artery disease  with his father who died at age 61 and who had a bypass prior to that time.  He did have lung cancer as well.  In any event, he does not recollect the  history of hypertension or diabetes.  He is being admitted for 23-hour  observation to rule out an acute ischemic event.  His EKG in the emergency  department was normal sinus without evidence of ischemia.  His D-dimer was  elevated at 0.78.  CT scan per review of Dr. Bjorn Loser was negative.  Currently  awaiting formal review and will implement therapies if significant findings.   PAST MEDICAL HISTORY:  Chronic ulcerative colitis.  Alcohol use, status post  laparoscopic cholecystectomy per Dr. Rise Patience.   Status post ERCP and  sphincterotomy.  A stone extraction on October 10, 2004 by Dr. Henrene Pastor.  He  is scheduled for a colonoscopy this coming Thursday.   MEDICATIONS:  Azulfidine 500 mg 3 tablets b.i.d.  He takes no other  medications.   SOCIAL HISTORY:  Works as a Building control surveyor.  He is married.  He has three children.  He denies tobacco.  He drinks about a six pack on the weekends.   FAMILY HISTORY:  As described above.   ALLERGIES:  No known drug allergies.   REVIEW OF SYSTEMS:  Patient's weight has been stable.  He has had about a 20  pound intentional weight loss following his cholecystectomy.  No nausea or  vomiting.  No diarrhea, hematemesis, hematochezia.  No lower extremity  edema.  No shortness of breath or orthopnea or dyspnea.  His review of  systems is unremarkable.   PHYSICAL EXAMINATION:  VITAL SIGNS:  Vital signs in the department were  normal.  Blood  pressure 119/76, temp 98.6, heart rate 83, respirations 16,  O2 saturation 96%.  GENERAL:  Patient is alert and oriented in no acute distress.  HEENT:  Head is normocephalic and atraumatic.  Extraocular muscles are  intact.  __________.  NECK:  Supple and nontender.  No palpable cardiomegaly or mass.  LUNGS:  Clear to auscultation bilaterally.  He exhibits normal effort.  There is no dullness to percussion.  CARDIOVASCULAR:  Normal S1 and S2 without murmurs, rubs or gallops.  ABDOMEN:  Soft and nontender.  No palpable tenderness.  No reproducible pain  to palpation.  No suprapubic costovertebral angle tenderness.  Bowel sounds  are normoactive.  EXTREMITIES:  Lower extremities reveal no edema.  Peripheral edema are  symmetrical and palpable.  NEUROLOGIC:  The patient is euthymic.  His affect is stable.   LABORATORY DATA:  Initial point-of-care markers in the department were  negative.   His EKG revealed a normal sinus rhythm.   CT per informal review was negative.  Well confirmed.   Sodium 139, potassium 5.2, BUN 9,  creatinine 1, glucose 85.  WBC 9.5,  hemoglobin 14.2, platelet count 287, MCV 94.  D-dimer is 0.78.   ASSESSMENT:  Chest pain, suspect noncardiac:  Will admit for observation to  rule out acute ischemia.  He can be discharged if these are negative.  Follow up with a primary care physician.  1.  Ulcerative colitis:  Seems to be under control at this time.   PLAN:  We are going to admit Mr. Kathol for 23-hour observation.  If his  markers are negative, he will be suitable for discharge.  I will formally  review a CT scan and make addendum subsequently.   Addendum:  Mr. Niswander planned to leave AMA.  He was counseled to follow up and establish  a new PCP this week and return if he developed recurrent chest pain.  He was  discharged from the ER.      Cyril Mourning, D.O.  Electronically Signed     ESS/MEDQ  D:  12/21/2004  T:  12/21/2004  Job:  [id number]

## 2010-06-26 NOTE — Discharge Summary (Signed)
NAME:  HOOKSOzro, Russett                ACCOUNT NO.:  [id number]   MEDICAL RECORD NO.:  [id number]          PATIENT TYPE:  INP   LOCATION:  3031                         FACILITY:  [place]   PHYSICIAN:  Docia Chuck. Henrene Pastor, M.D. [facility] BIRTH:  07-30-1955   DATE OF ADMISSION:  10/25/2004  DATE OF DISCHARGE:  10/27/2004                                 DISCHARGE SUMMARY   ADMITTING DIAGNOSES:  1.  Symptomatic choledocholithiasis.  2.  Chronic ulcerative colitis.  3.  Alcohol overuse.  4.  Mild hyperglycemia consistent with glucose intolerance; may be      prediabetic versus reactive to acute illness.  5.  Status post laparoscopic cholecystectomy September 20, 2004 by Dr. Jeanella Anton.  6.  History of gastroesophageal reflux disease with infrequent symptoms.  7.  History of acute hepatitis in 2001, question viral versus alcoholic in      etiology.  8.  Diverticulosis noted on CT scan in 2001.  9.  He has had colonoscopy in the past not clear what year this was.  10. Status post left knee arthroscopy.   DISCHARGE DIAGNOSES:  1.  Choledocholithiasis associated with epigastric pain and nausea.  LFTs      bumped up substantially.  2.  Status post endoscopic retrograde cholangiopancreatography with      sphincterotomy and stone extraction on October 26, 2004.  3.  Ulcerative colitis, quiescent, despite marginal compliance with his      medication regimen.  4.  Laparoscopic cholecystectomy August 2006.  5.  Regular heavy alcohol consumption.   PROCEDURE:  Endoscopic retrograde cholangiopancreatography with  sphincterotomy and extraction of two stones from the common bile duct by Dr.  Scarlette Shorts on October 26, 2004.   BRIEF HISTORY:  Mr. Bartholomew is a pleasant, 55 year old gentleman.  He has a  history of ulcerative colitis and is followed by Dr. Rachelle Hora, and is  not particularly compliant with the full dosing of his Azulfidine, but has  not had any symptoms of colitis of late.   In August he presented to the  hospital with severe back pain.  On ultrasound symptomatic cholelithiasis.  There was no evidence for acute cholecystitis and no stones or sludge seen.  Biliary ducts were of normal diameter.  CT scan on August 12 demonstrated  some small gallbladder stones and possibly a stone in the cystic duct.  He  underwent laparoscopic cholecystectomy by Dr. Rise Patience.  The intraoperative  cholangiogram did not demonstrate any choledocholithiasis.   The patient well following surgery.  However, on the day of this admission  of October 25, 2004 he developed acute epigastric pain radiating to his  back about an hour after he finished a game of golf and ate a tuna fish  sandwich.  He was a bit nauseous and diaphoretic.  He presented to the  emergency room.  A CT scan demonstrated possible stone in the distal bile  duct.  The common bile duct diameter was 11 mm.  The only LFT abnormality  was slightly increased AST level at 45.  Otherwise liver tests  were within  normal limits.  Dr. Carlean Purl admitted the patient with a diagnosis of  symptomatic choledocholithiasis.   LABORATORIES:  Hemoglobin 14.1, hematocrit 42.7, white blood cell count  11.2, MCV 93.4, platelets 307.  Sodium 138, potassium went from 3.4 up to  4.4, BUN 9, creatinine 1.0, glucose ranged from 109 to 117.  Total bilirubin  maximum 2.1.  It was 0.8 at discharge.  Alkaline phosphatase maximum 188 at  discharge.  AST went from 736 down to 210. ALT high of 623, and 487 at  discharge.  Albumin 3.3, calcium 8.8, amylase 80, lipase 54.  CK/MB was less  than 1.  Troponin I less than 0.05.  Myoglobin 47.1.  Urinalysis showed 15  mg of ketones, a small amount of blood but only 0-2 red blood cells per high  power field, 0-2 white blood cells per high power field.  Nitrite and  leukocyte esterase negative.   IMAGING STUDIES:  CT scan of the abdomen and pelvis showed  postcholecystectomy appearance.  There was a 4 mm  stone in the region of the  ampulla suspicious for obstructing distal common bile duct stone.  The  common bile duct was 11 mm.  Otherwise CT scan of the abdomen and pelvis was  within normal limits.   HOSPITAL COURSE:  The patient was admitted to regular floor bed.  He was  started on IV Unasyn.  Diet was clear liquids.  Overnight the patient  continued to have some epigastric and right upper quadrant pain radiating  into the back.  This was not severe.  He did not have any nausea or  vomiting. Followup LFTs on the 18th compared with those on the 17th did show  a significant bump in his transaminases, as well as increases in total  bilirubin and alkaline phosphatase.  He underwent ERCP on the 18th.  Dr.  Henrene Pastor did perform sphincterotomy and removed two pigmented stones.   Followups, both in the evening following the ERCP as well as in the morning  of the 19th, he clinically was quite improved.  He was tolerating a full  liquid diet.  He was having no significant pain and no nausea.  He was  stable and discharged to home 1 day following the ERCP.  He was in much  improved condition.  Followup is on October 4, with Dr. Rachelle Hora.  At  that point he is probably going to need to be scheduled for a colonoscopy.  He was given a new prescription for his Azulfidine with a year's worth of  refills.   MEDICATIONS AT DISCHARGE:  1.  Azulfidine 500 mg 3 p.o. b.i.d.  2.  Zantac 75 as needed.  3.  Advil two 400 mg on occasion every several days as needed.  4.  Excedrin as needed.  Note, he was told not to use Excedrin or aspirin      for the next 10 days and he was told to limit the use of these      medications to once a week or so as he was advised that these might      flare his colitis if he used these medications in significant      quantities.      Azucena Freed, P.A. [facility]    ______________________________  Docia Chuck. Henrene Pastor, M.D. [facility]   SG/MEDQ  D:  10/27/2004  T:  10/27/2004   Job:  [id number]

## 2010-06-26 NOTE — Op Note (Signed)
NAME:  Isaiah Miller                ACCOUNT NO.:  [id number]   MEDICAL RECORD NO.:  [id number]          PATIENT TYPE:  INP   LOCATION:  0101                         FACILITY:  [facility]   PHYSICIAN:  Orson Ape. Weatherly, M.D.DATE OF BIRTH:  06-04-1955   DATE OF PROCEDURE:  09/20/2004  DATE OF DISCHARGE:                                 OPERATIVE REPORT   PREOPERATIVE DIAGNOSIS:  Cholecystitis with a cystic duct stone.   POSTOPERATIVE DIAGNOSIS:  Cholecystitis with a cystic duct stone.   OPERATION:  Laparoscopic cholecystectomy with cholangiogram.   SURGEON:  Orson Ape. Rise Patience, M.D.   ASSISTANT:  Isaiah Miller, M.D.   HISTORY:  Isaiah Miller is a 55 year old male who presented to the emergency  room this morning with the onset of epigastric pain at approximately 3 a.m.  He has had spaghetti for dinner last night, has a history of ulcerative  colitis and when he was seen by the ER physician he thought this was most  likely a gallbladder problem and an ultrasound of the gallbladder was  obtained.  This showed a distended gallbladder, but no stones.  Because of  the pain and his white count was only about 12,000 a CT was obtained and  this showed a stone in the cystic duct and a very distended gallbladder.  I  was asked to see the patient.  He had had about 3 injections of pain  medication; and on examination, he had a little fullness with tenderness in  the right upper quadrant, but not really an acute surgical abdomen.  The  patient desired to proceed on with surgery and I was in agreement.   Preoperative he was given 3 gm of Unasyn.  He has BAS stockings.  He has a  history of chronic ulcerative colitis for which he takes Azulfidine and is  followed by the [facility].  Approximately 2 years ago he had a similar  episode of pain; and at that time, no stones in the gallbladder were  obtained and his symptoms kind of subsided.   DESCRIPTION OF PROCEDURE:  He was positioned on the OR  table, induction of  general endotracheal anesthesia by Dr. __________ , the stomach was  decompressed with an oral tube into the stomach and then the abdomen was  prepped with Betadine scrub and solution and draped in a sterile manner.   A small incision was made below the umbilicus.  The fascia was identified.  This was kind of opened down the line.  The peritoneum was identified and  was opened.  A purse-string suture of #0 Vicryl was placed and then the  Hasson cannula introduced.  The patient has a very generous omentum  completely covering the small bowel and you could see a distended  gallbladder. It was not acute hemorrhagic, and the upper 10 mm trocar was  placed in the subxiphoid area and the 2 lateral 5-mm trocars were placed in  the right lateral by Dr. Bubba Camp.  The gallbladder was retracted upward and  outward and it was very tense and the proximal portion of the gallbladder  was teased and identified the cystic duct and also the cystic artery.  In  the cystic artery 2 clips were placed proximally and 1 distally and then  divided and then I could encompass the cystic duct. The distal cystic duct  was fairly dilated, but a clip would go across it and then in the proximal  portion of the cystic duct a small opening was made and there was some bile  coming back, but I could not see an actual stone that I milked back.   I then placed a Cook catheter in this, held it in place with 2 clips, and  then an x-ray was obtained.  There was good flow into the cystic duct and  you could see the distal common bile duct.  You could see the intrahepatic  ducts satisfactory and we could not see any evidence of any stones in the  common bile duct.  The catheter was withdrawn and I tried to go down about  another centimeter, so if the stone was actually in the cystic duct,  hopefully we would be clipping it proximal to it.  We could not actually see  the stone on the cystic duct injection.    Three clips were placed across the cystic duct and then it was divided  distally to these three clips and then the gallbladder was freed from its  bed using predominately hook electrocautery.  Good hemostasis was obtained.  There was a little bit of clear bile leaking down from the gallbladder where  the proximal over the cystic duct had been cross-clamped with a hemoclip.  We placed the gallbladder in an EndoCatch bag and then irrigated and  aspirated; reinspected the proximal cystic duct with no evidence of  bleeding.  The irrigating fluid was aspirated.   We switched the camera to the upper 10-mm port and switched back to the  straight camera, where we had been using the 30-degree scope because of its  size.  The irrigating fluid had been aspirated.  No evidence of any bleeding  or bile; and then the bag containing the gallbladder was withdrawn.  The  gallbladder was later opened on the back table and was subacutely inflamed,  but I couldn't really find a stone within the gallbladder.  There was a lot  of old sludge junk-type thing within it.  The figure-of-eight additional  suture was placed in the fascia and then the 5-mm port was withdrawn and the  upper 10-mm was withdrawn under direct vision.  The subcutaneous wounds were  closed with 4-0 Vicryl and benzoin and Steri-Strips on the skin.  The  patient tolerated the procedure nicely and was sent to the recovery room in  stable postop condition.       WJW/MEDQ  D:  09/20/2004  T:  09/21/2004  Job:  [id number]   cc:   [facility]

## 2010-06-26 NOTE — H&P (Signed)
NAME:  HOOKSWaylin, [name]                ACCOUNT NO.:  [id number]   MEDICAL RECORD NO.:  [id number]          PATIENT TYPE:  INP   LOCATION:  0101                         [facility]ACILITY:  [facility]   PHYSICIAN:  Orson Ape. Weatherly, M.D.DATE OF BIRTH:  04-06-1955   DATE OF ADMISSION:  09/20/2004  DATE OF DISCHARGE:                                HISTORY & PHYSICAL   CHIEF COMPLAINT:  Epigastric pain.   HISTORY:  Isaiah Miller is a 55 year old male who presented to the  emergency room this morning with approximately a 3-4 hour history of severe  epigastric pain.  He was seen by the ER physician, an IV started, and given  IV pain medication, and after two injections, his pain appeared to subside.  He was thought to have probably a gallbladder problem and was sent over for  an ultrasound, and this showed a dilated gallbladder, but it could not  demonstrate a stone.  He was then given an additional narcotic injection,  and since he was still have pain, sent over for a CT. This was done without  contrast, and you could see a definite calcified stone that appeared to be  in the cystic duct.  I was asked to see the patient, and I saw him at  approximately 3 o'clock.  He had been in the ER since about 7:30, and at  that time, his pain appears to have subsided.   PAST HISTORY:  Significant in that he has an 18-year history of ulcerative  colitis and is followed by various physicians at [place], and he is on  azulfidine three tablets b.i.d.  He denies any other chronic symptoms.   SOCIAL HISTORY:  He works as a Building control surveyor and otherwise is in good health.  He  does not smoke and uses minimal alcohol.  The patient is married and has two  children.   PAST SURGICAL HISTORY:  No previous surgeries except for knee surgeries.   FAMILY HISTORY:  Noncontributory.   PHYSICAL EXAMINATION:  GENERAL:  He is a pleasant, slightly overweight male  not in any acute distress now.  He is a large male, I would describe  as  moderately overweight, about 225 pounds.  VITAL SIGNS IN THE EMERGENCY ROOM:  He has had no fever.  Temperature was  97.4, pulse 82, respirations 16, blood pressure 109/68.  EYES, EARS, NOSE, AND THROAT:  Negative.  CHEST:  Good breath sounds bilaterally.  BREASTS:  Negative.  HEART:  Normal sinus rhythm.  ABDOMEN:  He is not acutely tender with deep palpation in the right upper  quadrant.  He says it causes discomfort.  He is nontender in the lower  abdomen.  His last colonoscopy, he says, was about three years ago, and he  is due for one some time in the near future. He is not having problems with  chronic diarrhea.  I did not check to see if there was any blood in his  stool.  EXTREMITIES:  Unremarkable.  CENTRAL NERVOUS SYSTEM:  Physiologic.  SKIN:  Negative.   ADMISSION IMPRESSION:  1.  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  2.  History of ulcerative colitis.   PLAN:  Laparoscopic cholecystectomy with cholangiogram.  The patient will be  given 3 g of __________ preoperatively.       WJW/MEDQ  D:  09/20/2004  T:  09/20/2004  Job:  [id number]

## 2010-07-15 ENCOUNTER — Other Ambulatory Visit: Payer: Self-pay | Admitting: Internal Medicine

## 2010-07-16 NOTE — Telephone Encounter (Signed)
REFILLED 6 MP. Put a message in that patient needs an appointment.

## 2010-08-16 ENCOUNTER — Other Ambulatory Visit: Payer: Self-pay | Admitting: Internal Medicine

## 2010-08-17 NOTE — Telephone Encounter (Signed)
Left message for patient to call the office. Patient needs an appointment.

## 2010-08-17 NOTE — Telephone Encounter (Signed)
Medication refilled for #240 with 0 refills until patients appointment.

## 2010-09-18 ENCOUNTER — Ambulatory Visit (INDEPENDENT_AMBULATORY_CARE_PROVIDER_SITE_OTHER): Payer: 59 | Admitting: Internal Medicine

## 2010-09-18 ENCOUNTER — Other Ambulatory Visit (INDEPENDENT_AMBULATORY_CARE_PROVIDER_SITE_OTHER): Payer: 59

## 2010-09-18 ENCOUNTER — Encounter: Payer: Self-pay | Admitting: Internal Medicine

## 2010-09-18 VITALS — BP 120/80 | HR 76 | Ht 70.0 in | Wt 220.6 lb

## 2010-09-18 DIAGNOSIS — E559 Vitamin D deficiency, unspecified: Secondary | ICD-10-CM

## 2010-09-18 DIAGNOSIS — E669 Obesity, unspecified: Secondary | ICD-10-CM

## 2010-09-18 DIAGNOSIS — K5190 Ulcerative colitis, unspecified, without complications: Secondary | ICD-10-CM

## 2010-09-18 DIAGNOSIS — N529 Male erectile dysfunction, unspecified: Secondary | ICD-10-CM

## 2010-09-18 LAB — COMPREHENSIVE METABOLIC PANEL
ALT: 32 U/L (ref 0–53)
Albumin: 4.4 g/dL (ref 3.5–5.2)
BUN: 17 mg/dL (ref 6–23)
CO2: 27 mEq/L (ref 19–32)
Calcium: 9.5 mg/dL (ref 8.4–10.5)
Chloride: 107 mEq/L (ref 96–112)
Creatinine, Ser: 0.9 mg/dL (ref 0.4–1.5)
GFR: 97.15 mL/min (ref >60.00)
Total Protein: 7.5 g/dL (ref 6.0–8.3)

## 2010-09-18 LAB — CBC WITH DIFFERENTIAL/PLATELET
HCT: 43 % (ref 39.0–52.0)
MCV: 96.3 fl (ref 78.0–100.0)
Neutro Abs: 4.8 10*3/uL (ref 1.4–7.7)
RBC: 4.46 Mil/uL (ref 4.22–5.81)
WBC: 8.3 10*3/uL (ref 4.5–10.5)

## 2010-09-18 LAB — VITAMIN D 25 HYDROXY (VIT D DEFICIENCY, FRACTURES): Vit D, 25-Hydroxy: 28 ng/mL — ABNORMAL LOW (ref 30–89)

## 2010-09-18 MED ORDER — SULFASALAZINE 500 MG PO TABS: ORAL_TABLET | ORAL | Status: DC

## 2010-09-18 NOTE — Progress Notes (Signed)
  Subjective:    Patient ID: Isaiah Miller, male    DOB: 26-Mar-1955, 55 y.o.   MRN: [id number]  HPI Annual visit without any problems with his colitis - no diarrhea, abdominal pain or bleeding. He has not been completely compliant with his therapy. Says he is improving, though.  Asking about difficulty maintaining an erection at times. "I want to try something (medication)".  Review of Systems As above    Objective:   Physical Exam  Constitutional: He appears well-developed and well-nourished.       Obese WM NAD  Eyes: No scleral icterus.  Cardiovascular: Normal rate, regular rhythm and normal heart sounds.   Pulmonary/Chest: Effort normal and breath sounds normal.  Abdominal: Bowel sounds are normal. He exhibits no distension and no mass. There is no tenderness.  Genitourinary: Rectum normal and prostate normal.          Assessment & Plan:

## 2010-09-18 NOTE — Patient Instructions (Addendum)
Please go to the basement upon leaving today to have your labs done. Your prescription(s) has(have) been sent to your pharmacy for you to pick up. Your Colonoscopy Recall has been changed to Feb 2014. Keep trying to lose weight to 200 pounds. See dr. Larose Kells as we discussed. Return in 1 year to see Dr. Carlean Purl.

## 2010-09-18 NOTE — Assessment & Plan Note (Addendum)
Asymptomatic. Will refill sulfasalazine - 4 grams total daily - 2 g bid.. He has not been competely compliant but indicated he is working on this. Next routine colonoscopy changed  to 2013 (3 year interval given relative inactive disease). Will call and add folic acid back to therapy.

## 2010-09-19 ENCOUNTER — Encounter: Payer: Self-pay | Admitting: Internal Medicine

## 2010-09-19 DIAGNOSIS — E669 Obesity, unspecified: Secondary | ICD-10-CM | POA: Insufficient documentation

## 2010-09-19 DIAGNOSIS — N529 Male erectile dysfunction, unspecified: Secondary | ICD-10-CM | POA: Insufficient documentation

## 2010-09-19 MED ORDER — ERGOCALCIFEROL 1.25 MG (50000 UT) PO CAPS: 50000.0000 [IU] | ORAL_CAPSULE | ORAL | Status: AC

## 2010-09-19 MED ORDER — FOLIC ACID 1 MG PO TABS: 1.0000 mg | ORAL_TABLET | Freq: Every day | ORAL | Status: DC

## 2010-09-19 MED ORDER — VITAMIN D 1000 UNITS PO TABS: 1000.0000 [IU] | ORAL_TABLET | Freq: Every day | ORAL | Status: DC

## 2010-09-19 NOTE — Assessment & Plan Note (Signed)
Problems maintaining erection - referred to PCP - he will make an appointment to discuss further evaluation and treatment.

## 2010-09-19 NOTE — Assessment & Plan Note (Signed)
Labs reassessed and low again - will supplement - see meds and orders.

## 2010-09-19 NOTE — Assessment & Plan Note (Signed)
Mild with BMI 31 He is aware and wants to reduce caloric intake to get to 200# or less.

## 2010-09-21 ENCOUNTER — Other Ambulatory Visit: Payer: Self-pay

## 2011-10-02 ENCOUNTER — Other Ambulatory Visit: Payer: Self-pay | Admitting: Internal Medicine

## 2012-01-26 ENCOUNTER — Ambulatory Visit (INDEPENDENT_AMBULATORY_CARE_PROVIDER_SITE_OTHER): Payer: 59 | Admitting: Emergency Medicine

## 2012-01-26 VITALS — BP 136/89 | HR 87 | Temp 97.9°F | Resp 18 | Ht 70.0 in | Wt 235.0 lb

## 2012-01-26 DIAGNOSIS — J018 Other acute sinusitis: Secondary | ICD-10-CM

## 2012-01-26 MED ORDER — AMOXICILLIN-POT CLAVULANATE 875-125 MG PO TABS: 1.0000 | ORAL_TABLET | Freq: Two times a day (BID) | ORAL | Status: DC

## 2012-01-26 MED ORDER — PSEUDOEPHEDRINE-GUAIFENESIN ER 60-600 MG PO TB12: 1.0000 | ORAL_TABLET | Freq: Two times a day (BID) | ORAL | Status: AC

## 2012-01-26 NOTE — Progress Notes (Signed)
[facility] [address] [place] [address] [phone]  Date:  01/26/2012   Name:  Isaiah Miller   DOB:  02/06/1956   MRN:  [id number]  PCP:  Kathlene November, MD    Chief Complaint: URI   History of Present Illness:  Isaiah Miller is a 56 y.o. very pleasant male patient who presents with the following:  Caring for his wife with breast cancer.  Neglecting his own needs.  Has a cough and congestion for one month.  Says he has post nasal drainage and a cough productive of purulent sputum.  No wheezing or shortness of breath.  Feels like most of the the expectorated material is drainage from his sinuses.  No fever or chills.  No nausea or vomiting.  Has improved with OTC meds in that he no longer has pressure and pain in his cheeks and forehead.  No sick contacts  Patient Active Problem List  Diagnosis  . VITAMIN D DEFICIENCY  . Universal ulcerative (chronic) colitis  . Obesity  . ED (erectile dysfunction)    Past Medical History  Diagnosis Date  . Ulcerative colitis     dx 1987 left and then more universal  . Hepatitis     ? EtOH  . GERD (gastroesophageal reflux disease)   . Vitamin D deficiency     Past Surgical History  Procedure Date  . Cholecystectomy   . Colonoscopy w/ biopsies 05/2006, 03/2009    ulcerative colitis left and right, no dysplasia  . Knee surgery     History  Substance Use Topics  . Smoking status: Never Smoker   . Smokeless tobacco: Never Used  . Alcohol Use: Yes     Comment: occassional     Family History  Problem Relation Age of Onset  . Colon cancer Neg Hx     No Known Allergies  Medication list has been reviewed and updated.  Current Outpatient Prescriptions on File Prior to Visit  Medication Sig Dispense Refill  . ranitidine (ZANTAC) 150 MG tablet Take 150 mg by mouth as needed.        . sulfaSALAzine (AZULFIDINE) 500 MG tablet TAKE 4 TABLETS BY MOUTH TWICE DAILY  240 tablet  0  . cholecalciferol  (VITAMIN D) 1000 UNITS tablet Take 1 tablet (1,000 Units total) by mouth daily.      . folic acid (FOLVITE) 1 MG tablet Take 1 tablet (1 mg total) by mouth daily.  100 tablet  3    Review of Systems:  As per HPI, otherwise negative.    Physical Examination: Filed Vitals:   01/26/12 1152  BP: 136/89  Pulse: 87  Temp: 97.9 F (36.6 C)  Resp: 18   Filed Vitals:   01/26/12 1152  Height: 5' 10"  (1.778 m)  Weight: 235 lb (106.595 kg)   Body mass index is 33.72 kg/(m^2). Ideal Body Weight: Weight in (lb) to have BMI = 25: 173.9   GEN: WDWN, NAD, Non-toxic, A & O x 3 HEENT: Atraumatic, Normocephalic. Neck supple. No masses, No LAD.  Oropharynx has green posterior pharyngeal wall drainage Ears and Nose: No external deformity. CV: RRR, No M/G/R. No JVD. No thrill. No extra heart sounds. PULM: CTA B, no wheezes, crackles, rhonchi. No retractions. No resp. distress. No accessory muscle use. ABD: S, NT, ND, +BS. No rebound. No HSM. EXTR: No c/c/e NEURO Normal gait.  PSYCH: Normally interactive. Conversant. Not depressed or anxious appearing.  Calm demeanor.  Assessment and Plan: Sinusitis augmentin mucinex d Follow up as needed  Roselee Culver, MD

## 2012-01-26 NOTE — Patient Instructions (Signed)

## 2012-02-04 NOTE — Progress Notes (Signed)
Reviewed and agree.

## 2012-02-14 ENCOUNTER — Other Ambulatory Visit: Payer: Self-pay | Admitting: Emergency Medicine

## 2012-03-27 ENCOUNTER — Encounter: Payer: Self-pay | Admitting: Internal Medicine

## 2012-05-12 ENCOUNTER — Encounter: Payer: Self-pay | Admitting: Internal Medicine

## 2012-05-12 ENCOUNTER — Other Ambulatory Visit (INDEPENDENT_AMBULATORY_CARE_PROVIDER_SITE_OTHER): Payer: 59

## 2012-05-12 ENCOUNTER — Ambulatory Visit (INDEPENDENT_AMBULATORY_CARE_PROVIDER_SITE_OTHER): Payer: 59 | Admitting: Internal Medicine

## 2012-05-12 VITALS — BP 120/78 | HR 100 | Ht 70.0 in | Wt 232.4 lb

## 2012-05-12 DIAGNOSIS — E669 Obesity, unspecified: Secondary | ICD-10-CM

## 2012-05-12 DIAGNOSIS — K5100 Ulcerative (chronic) pancolitis without complications: Secondary | ICD-10-CM

## 2012-05-12 DIAGNOSIS — E559 Vitamin D deficiency, unspecified: Secondary | ICD-10-CM

## 2012-05-12 LAB — CBC WITH DIFFERENTIAL/PLATELET
Basophils Relative: 0.2 % (ref 0.0–3.0)
Eosinophils Relative: 1.4 % (ref 0.0–5.0)
HCT: 41.3 % (ref 39.0–52.0)
Lymphs Abs: 2.1 10*3/uL (ref 0.7–4.0)
MCV: 94.9 fl (ref 78.0–100.0)
Monocytes Absolute: 0.9 10*3/uL (ref 0.1–1.0)
Monocytes Relative: 10.1 % (ref 3.0–12.0)
Platelets: 283 10*3/uL (ref 150.0–400.0)
RBC: 4.35 Mil/uL (ref 4.22–5.81)
WBC: 8.9 10*3/uL (ref 4.5–10.5)

## 2012-05-12 LAB — COMPREHENSIVE METABOLIC PANEL
Albumin: 4 g/dL (ref 3.5–5.2)
Alkaline Phosphatase: 68 U/L (ref 39–117)
BUN: 18 mg/dL (ref 6–23)
CO2: 26 mEq/L (ref 19–32)
GFR: 97.86 mL/min (ref >60.00)
Glucose, Bld: 74 mg/dL (ref 70–99)
Potassium: 4.5 mEq/L (ref 3.5–5.1)
Sodium: 140 mEq/L (ref 135–145)
Total Protein: 7.2 g/dL (ref 6.0–8.3)

## 2012-05-12 MED ORDER — SULFASALAZINE 500 MG PO TABS: 2000.0000 mg | ORAL_TABLET | Freq: Two times a day (BID) | ORAL | Status: DC

## 2012-05-12 NOTE — Progress Notes (Signed)
  Subjective:    Patient ID: Isaiah Miller, male    DOB: Apr 06, 1955, 57 y.o.   MRN: [id number]  HPI Here for follow-up of ulcerative colitis Denies diarrhea, abdominal pain or bleeding. Last 2 yrs difficult - wife had TKR, then breast cancer. 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 Still taking chemotx. Medications, allergies, past medical history, past surgical history, family history and social history are reviewed and updated in the EMR.   Review of Systems As above    Objective:   Physical Exam General:  NAD Eyes:   anicteric Lungs:  clear Heart:  S1S2 no rubs, murmurs or gallops Abdomen:  soft and nontender, BS+ Ext:   no edema        Assessment & Plan:  Universal ulcerative colitis - Plan: CBC with Differential, Comprehensive metabolic panel  Unspecified vitamin D deficiency - Plan: Vitamin D (25 hydroxy)  Obesity (BMI 30.0-34.9)  1. Refill sulfasalzine 2. Colonoscopy later this year - June recall - cannot due with wife's issues at this time 3. CBC, CMET, vit D level 4. He is trying to lose weight

## 2012-05-12 NOTE — Patient Instructions (Addendum)
Your physician has requested that you go to the basement for lab work before leaving today.  We have sent medications to your pharmacy for you to pick up at your convenience.  Thank you for choosing me and [facility].  Gatha Mayer, M.D., [facility]

## 2012-05-13 LAB — VITAMIN D 25 HYDROXY (VIT D DEFICIENCY, FRACTURES): Vit D, 25-Hydroxy: 17 ng/mL — ABNORMAL LOW (ref 30–89)

## 2012-05-15 NOTE — Progress Notes (Signed)
Quick Note:  Vit D low again  1) vitamin D3 50,000 units weekly for 6 weeks 2) then recheck vit D level 3) I also recommend a bone density study - dx. Vit D deficiency ______

## 2012-05-16 ENCOUNTER — Other Ambulatory Visit: Payer: Self-pay

## 2012-05-16 DIAGNOSIS — K5190 Ulcerative colitis, unspecified, without complications: Secondary | ICD-10-CM

## 2012-05-16 MED ORDER — ERGOCALCIFEROL 1.25 MG (50000 UT) PO CAPS: 50000.0000 [IU] | ORAL_CAPSULE | ORAL | Status: DC

## 2012-05-25 ENCOUNTER — Inpatient Hospital Stay: Admission: RE | Admit: 2012-05-25 | Payer: 59 | Source: Ambulatory Visit

## 2012-05-25 ENCOUNTER — Ambulatory Visit (INDEPENDENT_AMBULATORY_CARE_PROVIDER_SITE_OTHER)
Admission: RE | Admit: 2012-05-25 | Discharge: 2012-05-25 | Disposition: A | Discharge status: Home / Self Care | Payer: 59 | Source: Ambulatory Visit | Attending: Internal Medicine | Admitting: Internal Medicine

## 2012-05-25 DIAGNOSIS — K5190 Ulcerative colitis, unspecified, without complications: Secondary | ICD-10-CM

## 2012-06-06 ENCOUNTER — Encounter: Payer: Self-pay | Admitting: Internal Medicine

## 2012-06-06 NOTE — Progress Notes (Signed)
Quick Note:  Bones are normal - good news at this point rec that he is getting 1200-1500 mg calcium daily Will follow-up more after vit D recheck ______

## 2012-06-27 ENCOUNTER — Telehealth: Payer: Self-pay

## 2012-06-27 NOTE — Telephone Encounter (Signed)
Patient's wife notified that the patient needs to come for non-fasting lab work.  She will have him call for any questions or concerns

## 2012-06-27 NOTE — Telephone Encounter (Signed)
Message copied by Marlon Pel on Tue Jun 27, 2012  8:36 AM ------      Message from: Marlon Pel      Created: Tue May 16, 2012  9:31 AM       Needs vitamin d level - orders in ------

## 2012-06-30 ENCOUNTER — Other Ambulatory Visit: Payer: 59

## 2012-06-30 DIAGNOSIS — K5190 Ulcerative colitis, unspecified, without complications: Secondary | ICD-10-CM

## 2012-07-04 NOTE — Progress Notes (Signed)
Quick Note:  Good response to supplementation  Take 1000 IU vit D3 daily  ______

## 2012-08-02 ENCOUNTER — Ambulatory Visit (INDEPENDENT_AMBULATORY_CARE_PROVIDER_SITE_OTHER): Payer: 59 | Admitting: Physician Assistant

## 2012-08-02 VITALS — BP 120/82 | HR 88 | Temp 98.0°F | Resp 16 | Ht 69.0 in | Wt 231.0 lb

## 2012-08-02 DIAGNOSIS — R0981 Nasal congestion: Secondary | ICD-10-CM

## 2012-08-02 DIAGNOSIS — J3489 Other specified disorders of nose and nasal sinuses: Secondary | ICD-10-CM

## 2012-08-02 DIAGNOSIS — J029 Acute pharyngitis, unspecified: Secondary | ICD-10-CM

## 2012-08-02 LAB — POCT RAPID STREP A (OFFICE): Rapid Strep A Screen: NEGATIVE

## 2012-08-02 MED ORDER — AMOXICILLIN 875 MG PO TABS: 875.0000 mg | ORAL_TABLET | Freq: Two times a day (BID) | ORAL | Status: DC

## 2012-08-02 NOTE — Progress Notes (Signed)
  Subjective:    Patient ID: Isaiah Miller, male    DOB: [birth year], 56 y.o.   MRN: [id number]  HPI 57 year old male presents with acute onset of sore throat, headache, and chills.  States symptoms started last night and have progressively worsened.  Does admit his daughter was diagnosed with strep throat 3 days ago.  He has not had documented fever but has felt achy and chilled.  Also has nasal congestion with PND associated with some nausea and decreased appetite.   Otherwise doing well with no other concerns today.     Review of Systems  Constitutional: Negative for fever and chills.  HENT: Positive for congestion, sore throat, rhinorrhea and postnasal drip.   Gastrointestinal: Positive for nausea. Negative for vomiting and abdominal pain.  Neurological: Positive for headaches. Negative for dizziness.       Objective:   Physical Exam  Constitutional: He is oriented to person, place, and time. He appears well-developed and well-nourished.  HENT:  Head: Normocephalic and atraumatic.  Right Ear: Hearing, tympanic membrane, external ear and ear canal normal.  Left Ear: Hearing, tympanic membrane, external ear and ear canal normal.  Mouth/Throat: Uvula is midline and mucous membranes are normal. Posterior oropharyngeal erythema present. No oropharyngeal exudate, posterior oropharyngeal edema or tonsillar abscesses.  Eyes: Conjunctivae are normal.  Neck: Normal range of motion.  Cardiovascular: Normal rate, regular rhythm and normal heart sounds.   Pulmonary/Chest: Effort normal and breath sounds normal.  Neurological: He is alert and oriented to person, place, and time.  Psychiatric: He has a normal mood and affect. His behavior is normal. Judgment and thought content normal.          Assessment & Plan:  Acute pharyngitis - Plan: POCT rapid strep A, Culture, Group A Strep, amoxicillin (AMOXIL) 875 MG tablet  Nasal congestion  Despite negative rapid strep today, will go  ahead and cover for strep with amoxicillin due to contact with known positive Throat culture sent Increase fluids and rest Out of work today.  Follow up if symptoms worsen or fail to improve.

## 2012-08-03 ENCOUNTER — Encounter: Payer: Self-pay | Admitting: Internal Medicine

## 2012-08-04 LAB — CULTURE, GROUP A STREP: Organism ID, Bacteria: NORMAL

## 2012-11-27 ENCOUNTER — Other Ambulatory Visit: Payer: Self-pay | Admitting: Internal Medicine

## 2013-03-07 ENCOUNTER — Encounter: Payer: Self-pay | Admitting: Internal Medicine

## 2013-03-11 ENCOUNTER — Other Ambulatory Visit: Payer: Self-pay | Admitting: Internal Medicine

## 2013-04-04 ENCOUNTER — Other Ambulatory Visit (INDEPENDENT_AMBULATORY_CARE_PROVIDER_SITE_OTHER): Payer: 59

## 2013-04-04 ENCOUNTER — Other Ambulatory Visit: Payer: 59

## 2013-04-04 ENCOUNTER — Encounter: Payer: Self-pay | Admitting: Internal Medicine

## 2013-04-04 ENCOUNTER — Ambulatory Visit (INDEPENDENT_AMBULATORY_CARE_PROVIDER_SITE_OTHER): Payer: 59 | Admitting: Internal Medicine

## 2013-04-04 VITALS — BP 144/80 | HR 76 | Ht 69.0 in | Wt 242.0 lb

## 2013-04-04 DIAGNOSIS — K5100 Ulcerative (chronic) pancolitis without complications: Secondary | ICD-10-CM

## 2013-04-04 DIAGNOSIS — E559 Vitamin D deficiency, unspecified: Secondary | ICD-10-CM

## 2013-04-04 LAB — COMPREHENSIVE METABOLIC PANEL
ALBUMIN: 4.1 g/dL (ref 3.5–5.2)
ALK PHOS: 70 U/L (ref 39–117)
ALT: 26 U/L (ref 0–53)
AST: 21 U/L (ref 0–37)
BILIRUBIN TOTAL: 0.6 mg/dL (ref 0.3–1.2)
BUN: 15 mg/dL (ref 6–23)
CO2: 24 mEq/L (ref 19–32)
Calcium: 9.4 mg/dL (ref 8.4–10.5)
Chloride: 107 mEq/L (ref 96–112)
Creatinine, Ser: 0.9 mg/dL (ref 0.4–1.5)
GFR: 93.76 mL/min (ref >60.00)
Glucose, Bld: 92 mg/dL (ref 70–99)
POTASSIUM: 3.9 meq/L (ref 3.5–5.1)
Sodium: 139 mEq/L (ref 135–145)
Total Protein: 7.2 g/dL (ref 6.0–8.3)

## 2013-04-04 LAB — CBC WITH DIFFERENTIAL/PLATELET
BASOS PCT: 0.2 % (ref 0.0–3.0)
Basophils Absolute: 0 10*3/uL (ref 0.0–0.1)
EOS ABS: 0.2 10*3/uL (ref 0.0–0.7)
Eosinophils Relative: 1.9 % (ref 0.0–5.0)
HEMATOCRIT: 44 % (ref 39.0–52.0)
Hemoglobin: 14.1 g/dL (ref 13.0–17.0)
LYMPHS ABS: 2.9 10*3/uL (ref 0.7–4.0)
Lymphocytes Relative: 29.6 % (ref 12.0–46.0)
MCHC: 32.1 g/dL (ref 30.0–36.0)
MCV: 98.1 fl (ref 78.0–100.0)
MONO ABS: 0.8 10*3/uL (ref 0.1–1.0)
Monocytes Relative: 8.1 % (ref 3.0–12.0)
NEUTROS PCT: 60.2 % (ref 43.0–77.0)
Neutro Abs: 5.9 10*3/uL (ref 1.4–7.7)
PLATELETS: 292 10*3/uL (ref 150.0–400.0)
RBC: 4.48 Mil/uL (ref 4.22–5.81)
RDW: 14 % (ref 11.5–14.6)
WBC: 9.8 10*3/uL (ref 4.5–10.5)

## 2013-04-04 MED ORDER — SULFASALAZINE 500 MG PO TABS: ORAL_TABLET | ORAL | Status: DC

## 2013-04-04 NOTE — Assessment & Plan Note (Signed)
Repeat colonoscopy continue Azulfidine He is well - clinical remission Check CBC

## 2013-04-04 NOTE — Assessment & Plan Note (Signed)
To restart 1000 IU daily

## 2013-04-04 NOTE — Progress Notes (Signed)
    Subjective:    Patient ID: Isaiah Miller, male    DOB: 09/17/1955, 58 y.o.   MRN: [id number]  HPI The patient is here for f/u universal colitis. He does not have any c/o today - and says he has been well on current Tx. He is due for a surveillance colonoscopy. He has run out of vit D but plans to restart supplememnts.   Review of Systems As above; wife going through knee replacement rehab    Objective:   Physical Exam General:  NAD Eyes:   anicteric Lungs:  clear Heart:  S1S2 no rubs, murmurs or gallops Abdomen:  soft and nontender, B     Assessment & Plan:  Universal ulcerative (chronic) colitis - Plan: CBC with Differential, Comprehensive metabolic panel  VITAMIN D DEFICIENCY

## 2013-04-04 NOTE — Patient Instructions (Addendum)
It has been recommended to you by your physician that you have a(n) colonoscopy completed. Per your request, we did not schedule the procedure(s) today. Please contact our office at [phone] should you decide to have the procedure completed.  Your physician has requested that you go to the basement for the following lab work before leaving today: CBC, CMET  Please purchase Vitamin D3 and take 1000IU-2000IU daily.  We have sent the following medications to your pharmacy for you to pick up at your convenience: Sulfasalazine  I appreciate the opportunity to care for you.

## 2013-04-06 NOTE — Progress Notes (Signed)
Quick Note:  Labs are normal - let him know please  ______

## 2013-09-10 ENCOUNTER — Encounter: Payer: Self-pay | Admitting: Internal Medicine

## 2013-11-16 ENCOUNTER — Ambulatory Visit (AMBULATORY_SURGERY_CENTER): Payer: Self-pay | Admitting: *Deleted

## 2013-11-16 VITALS — Ht 70.0 in | Wt 225.6 lb

## 2013-11-16 DIAGNOSIS — K5100 Ulcerative (chronic) pancolitis without complications: Secondary | ICD-10-CM

## 2013-11-16 NOTE — Progress Notes (Signed)
No home 02 or cpap use. ewm  No diet pills. ewm  No egg or soy allergy. ewm  No problems with past sedation. emw

## 2013-11-26 ENCOUNTER — Ambulatory Visit (AMBULATORY_SURGERY_CENTER): Payer: 59 | Admitting: Internal Medicine

## 2013-11-26 ENCOUNTER — Encounter: Payer: Self-pay | Admitting: Internal Medicine

## 2013-11-26 VITALS — BP 109/78 | HR 75 | Temp 96.2°F | Resp 12 | Ht 70.0 in | Wt 225.0 lb

## 2013-11-26 DIAGNOSIS — K5100 Ulcerative (chronic) pancolitis without complications: Secondary | ICD-10-CM

## 2013-11-26 DIAGNOSIS — K51019 Ulcerative (chronic) pancolitis with unspecified complications: Secondary | ICD-10-CM

## 2013-11-26 MED ORDER — SODIUM CHLORIDE 0.9 % IV SOLN: 500.0000 mL | INTRAVENOUS | Status: DC

## 2013-11-26 NOTE — Op Note (Signed)
[address]  [address] [employer] [address] [place], [address]   COLONOSCOPY PROCEDURE REPORT  PATIENT: Isaiah, Miller  MR#: [id number] BIRTHDATE: 06/12/1955 , 23  yrs. old GENDER: male ENDOSCOPIST: Gatha Mayer, MD, [facility] PROCEDURE DATE:  11/26/2013 PROCEDURE:   Colonoscopy with biopsy First Screening Colonoscopy - Avg.  risk and is 50 yrs.  old or older - No.  Prior Negative Screening - Now for repeat screening. N/A  History of Adenoma - Now for follow-up colonoscopy & has been > or = to 3 yrs.  N/A  Polyps Removed Today? No.  Polyps Removed Today? No.  Recommend repeat exam, <10 yrs? Polyps Removed Today? No.  Recommend repeat exam, <10 yrs? Yes.  Polyps Removed Today? No.  Recommend repeat exam, <10 yrs? Yes.  High risk (family or personal hx). ASA CLASS:   Class II INDICATIONS:high risk previously diagnosed UC pancolitis. MEDICATIONS: Propofol 250 mg IV and Monitored anesthesia care  DESCRIPTION OF PROCEDURE:   After the risks benefits and alternatives of the procedure were thoroughly explained, informed consent was obtained.  The digital rectal exam revealed no abnormalities of the rectum.   The LB [id number]  endoscope was introduced through the anus and advanced to the cecum, which was identified by both the appendix and ileocecal valve. No adverse events experienced.   The quality of the prep was good, using MiraLax  The instrument was then slowly withdrawn as the colon was fully examined.      COLON FINDINGS: A normal appearing cecum, ileocecal valve, and appendiceal orifice were identified.  The ascending, transverse, descending, sigmoid colon, and rectum appeared unremarkable. Multiple random biopsies were performed using cold forceps.  Sample was obtained and sent to histology.  Retroflexed views revealed no abnormalities. The time to cecum=1 minutes 26 seconds.  Withdrawal time=12 minutes 49 seconds.  The scope was withdrawn and the procedure  completed. COMPLICATIONS: There were no immediate complications.  ENDOSCOPIC IMPRESSION: Normal colonoscopy; multiple random biopsies were performed using cold forceps  RECOMMENDATIONS: Office Visit 1 year Repeat colonoscopy 3 years likely  eSigned:  Gatha Mayer, MD, [facility] 11/26/2013 8:33 AM   cc: The Patient

## 2013-11-26 NOTE — Patient Instructions (Addendum)
Things look great.   Probably need another colonoscopy in 3 years.  I will let you know pathology results and when to have another routine colonoscopy by mail.  I appreciate the opportunity to care for you. Gatha Mayer, MD, FACG  YOU HAD AN ENDOSCOPIC PROCEDURE TODAY AT [facility]: Refer to the procedure report that was given to you for any specific questions about what was found during the examination.  If the procedure report does not answer your questions, please call your gastroenterologist to clarify.  If you requested that your care partner not be given the details of your procedure findings, then the procedure report has been included in a sealed envelope for you to review at your convenience later.  YOU SHOULD EXPECT: Some feelings of bloating in the abdomen. Passage of more gas than usual.  Walking can help get rid of the air that was put into your GI tract during the procedure and reduce the bloating. If you had a lower endoscopy (such as a colonoscopy or flexible sigmoidoscopy) you may notice spotting of blood in your stool or on the toilet paper. If you underwent a bowel prep for your procedure, then you may not have a normal bowel movement for a few days.  DIET: Your first meal following the procedure should be a light meal and then it is ok to progress to your normal diet.  A half-sandwich or bowl of soup is an example of a good first meal.  Heavy or fried foods are harder to digest and may make you feel nauseous or bloated.  Likewise meals heavy in dairy and vegetables can cause extra gas to form and this can also increase the bloating.  Drink plenty of fluids but you should avoid alcoholic beverages for 24 hours.  ACTIVITY: Your care partner should take you home directly after the procedure.  You should plan to take it easy, moving slowly for the rest of the day.  You can resume normal activity the day after the procedure however you should NOT DRIVE or  use heavy machinery for 24 hours (because of the sedation medicines used during the test).    SYMPTOMS TO REPORT IMMEDIATELY: A gastroenterologist can be reached at any hour.  During normal business hours, 8:30 AM to 5:00 PM Monday through Friday, call [phone].  After hours and on weekends, please call the GI answering service at [phone] who will take a message and have the physician on call contact you.   Following lower endoscopy (colonoscopy or flexible sigmoidoscopy):  Excessive amounts of blood in the stool  Significant tenderness or worsening of abdominal pains  Swelling of the abdomen that is new, acute  Fever of 100F or higher  FOLLOW UP: If any biopsies were taken you will be contacted by phone or by letter within the next 1-3 weeks.  Call your gastroenterologist if you have not heard about the biopsies in 3 weeks.  Our staff will call the home number listed on your records the next business day following your procedure to check on you and address any questions or concerns that you may have at that time regarding the information given to you following your procedure. This is a courtesy call and so if there is no answer at the home number and we have not heard from you through the emergency physician on call, we will assume that you have returned to your regular daily activities without incident.  SIGNATURES/CONFIDENTIALITY: You and/or  your care partner have signed paperwork which will be entered into your electronic medical record.  These signatures attest to the fact that that the information above on your After Visit Summary has been reviewed and is understood.  Full responsibility of the confidentiality of this discharge information lies with you and/or your care-partner.

## 2013-11-26 NOTE — Progress Notes (Signed)
Called to room to assist during endoscopic procedure.  Patient ID and intended procedure confirmed with present staff. Received instructions for my participation in the procedure from the performing physician.  

## 2013-11-26 NOTE — Progress Notes (Signed)
A/ox3 pleased with MAC, report to Karen RN 

## 2013-11-27 ENCOUNTER — Telehealth: Payer: Self-pay

## 2013-11-27 NOTE — Telephone Encounter (Signed)
  Follow up Call-  Call back number 11/26/2013  Post procedure Call Back phone  # [phone]  Permission to leave phone message Yes     Patient questions:  Do you have a fever, pain , or abdominal swelling? No. Pain Score  0 *  Have you tolerated food without any problems? Yes.    Have you been able to return to your normal activities? Yes.    Do you have any questions about your discharge instructions: Diet   No. Medications  No. Follow up visit  No.  Do you have questions or concerns about your Care? No.  Actions: * If pain score is 4 or above: No action needed, pain <4.

## 2013-11-30 ENCOUNTER — Emergency Department (HOSPITAL_BASED_OUTPATIENT_CLINIC_OR_DEPARTMENT_OTHER)
Admission: EM | Admit: 2013-11-30 | Discharge: 2013-11-30 | Disposition: A | Discharge status: Home / Self Care | Payer: 59 | Attending: Emergency Medicine | Admitting: Emergency Medicine

## 2013-11-30 ENCOUNTER — Emergency Department (HOSPITAL_BASED_OUTPATIENT_CLINIC_OR_DEPARTMENT_OTHER): Payer: 59

## 2013-11-30 ENCOUNTER — Ambulatory Visit (INDEPENDENT_AMBULATORY_CARE_PROVIDER_SITE_OTHER): Payer: 59 | Admitting: Internal Medicine

## 2013-11-30 ENCOUNTER — Telehealth: Payer: Self-pay | Admitting: Internal Medicine

## 2013-11-30 ENCOUNTER — Encounter (HOSPITAL_BASED_OUTPATIENT_CLINIC_OR_DEPARTMENT_OTHER): Payer: Self-pay | Admitting: Emergency Medicine

## 2013-11-30 ENCOUNTER — Encounter: Payer: Self-pay | Admitting: Internal Medicine

## 2013-11-30 VITALS — BP 119/81 | HR 91 | Temp 98.7°F | Wt 224.1 lb

## 2013-11-30 DIAGNOSIS — K219 Gastro-esophageal reflux disease without esophagitis: Secondary | ICD-10-CM | POA: Diagnosis not present

## 2013-11-30 DIAGNOSIS — M549 Dorsalgia, unspecified: Secondary | ICD-10-CM | POA: Diagnosis present

## 2013-11-30 DIAGNOSIS — R509 Fever, unspecified: Secondary | ICD-10-CM | POA: Diagnosis not present

## 2013-11-30 DIAGNOSIS — G4700 Insomnia, unspecified: Secondary | ICD-10-CM | POA: Insufficient documentation

## 2013-11-30 DIAGNOSIS — Z79899 Other long term (current) drug therapy: Secondary | ICD-10-CM | POA: Diagnosis not present

## 2013-11-30 DIAGNOSIS — R05 Cough: Secondary | ICD-10-CM

## 2013-11-30 DIAGNOSIS — Z8639 Personal history of other endocrine, nutritional and metabolic disease: Secondary | ICD-10-CM | POA: Insufficient documentation

## 2013-11-30 DIAGNOSIS — R109 Unspecified abdominal pain: Secondary | ICD-10-CM

## 2013-11-30 DIAGNOSIS — R1031 Right lower quadrant pain: Secondary | ICD-10-CM

## 2013-11-30 DIAGNOSIS — M545 Low back pain: Secondary | ICD-10-CM

## 2013-11-30 DIAGNOSIS — R059 Cough, unspecified: Secondary | ICD-10-CM

## 2013-11-30 LAB — CBC WITH DIFFERENTIAL/PLATELET
BASOS PCT: 0 % (ref 0–1)
Basophils Absolute: 0 10*3/uL (ref 0.0–0.1)
Eosinophils Absolute: 0 10*3/uL (ref 0.0–0.7)
Eosinophils Relative: 0 % (ref 0–5)
HEMATOCRIT: 41.9 % (ref 39.0–52.0)
Hemoglobin: 13.7 g/dL (ref 13.0–17.0)
LYMPHS PCT: 24 % (ref 12–46)
Lymphs Abs: 1.5 10*3/uL (ref 0.7–4.0)
MCH: 32.5 pg (ref 26.0–34.0)
MCHC: 32.7 g/dL (ref 30.0–36.0)
MCV: 99.3 fL (ref 78.0–100.0)
MONO ABS: 0.8 10*3/uL (ref 0.1–1.0)
Monocytes Relative: 13 % — ABNORMAL HIGH (ref 3–12)
NEUTROS PCT: 63 % (ref 43–77)
Neutro Abs: 4 10*3/uL (ref 1.7–7.7)
Platelets: 210 10*3/uL (ref 150–400)
RBC: 4.22 MIL/uL (ref 4.22–5.81)
RDW: 13 % (ref 11.5–15.5)
WBC: 6.3 10*3/uL (ref 4.0–10.5)

## 2013-11-30 LAB — URINALYSIS, ROUTINE W REFLEX MICROSCOPIC
GLUCOSE, UA: NEGATIVE mg/dL
Ketones, ur: NEGATIVE
Ketones, ur: 15 mg/dL — AB
LEUKOCYTES UA: NEGATIVE
Leukocytes, UA: NEGATIVE
Nitrite: NEGATIVE
Nitrite: NEGATIVE
PROTEIN: 30 mg/dL — AB
SPECIFIC GRAVITY, URINE: 1.025 (ref 1.000–1.030)
Specific Gravity, Urine: 1.03 (ref 1.005–1.030)
UROBILINOGEN UA: 0.2 (ref 0.0–1.0)
UROBILINOGEN UA: 1 mg/dL (ref 0.0–1.0)
Urine Glucose: NEGATIVE
pH: 5.5 (ref 5.0–8.0)
pH: 5.5 (ref 5.0–8.0)

## 2013-11-30 LAB — COMPREHENSIVE METABOLIC PANEL
ALT: 27 U/L (ref 0–53)
ANION GAP: 14 (ref 5–15)
AST: 32 U/L (ref 0–37)
Albumin: 3.9 g/dL (ref 3.5–5.2)
Alkaline Phosphatase: 69 U/L (ref 39–117)
BUN: 14 mg/dL (ref 6–23)
CO2: 24 mEq/L (ref 19–32)
Calcium: 9.3 mg/dL (ref 8.4–10.5)
Chloride: 100 mEq/L (ref 96–112)
Creatinine, Ser: 0.9 mg/dL (ref 0.50–1.35)
GFR calc Af Amer: >90 mL/min (ref >90)
GFR calc non Af Amer: >90 mL/min (ref >90)
Glucose, Bld: 94 mg/dL (ref 70–99)
Potassium: 4.1 mEq/L (ref 3.7–5.3)
Sodium: 138 mEq/L (ref 137–147)
TOTAL PROTEIN: 7.3 g/dL (ref 6.0–8.3)
Total Bilirubin: 0.3 mg/dL (ref 0.3–1.2)

## 2013-11-30 LAB — POCT URINALYSIS DIPSTICK
BILIRUBIN UA: NEGATIVE
GLUCOSE UA: NEGATIVE
KETONES UA: NEGATIVE
LEUKOCYTES UA: NEGATIVE
Nitrite, UA: NEGATIVE
SPEC GRAV UA: 1.025
Urobilinogen, UA: 1
pH, UA: 5

## 2013-11-30 LAB — URINE MICROSCOPIC-ADD ON

## 2013-11-30 LAB — I-STAT CG4 LACTIC ACID, ED: LACTIC ACID, VENOUS: 0.99 mmol/L (ref 0.5–2.2)

## 2013-11-30 LAB — LIPASE, BLOOD: LIPASE: 26 U/L (ref 11–59)

## 2013-11-30 MED ORDER — ACETAMINOPHEN 500 MG PO TABS
1000.0000 mg | ORAL_TABLET | Freq: Once | ORAL | Status: AC
  Administered 2013-11-30: 1000 mg via ORAL
  Filled 2013-11-30: qty 2

## 2013-11-30 MED ORDER — RAMELTEON 8 MG PO TABS: 8.0000 mg | ORAL_TABLET | Freq: Every day | ORAL | Status: DC

## 2013-11-30 MED ORDER — SODIUM CHLORIDE 0.9 % IV BOLUS (SEPSIS)
1000.0000 mL | Freq: Once | INTRAVENOUS | Status: AC
  Administered 2013-11-30: 1000 mL via INTRAVENOUS

## 2013-11-30 MED ORDER — ONDANSETRON HCL 4 MG/2ML IJ SOLN
4.0000 mg | Freq: Once | INTRAMUSCULAR | Status: AC
  Administered 2013-11-30: 4 mg via INTRAVENOUS
  Filled 2013-11-30: qty 2

## 2013-11-30 MED ORDER — METHOCARBAMOL 500 MG PO TABS: 1000.0000 mg | ORAL_TABLET | Freq: Four times a day (QID) | ORAL | Status: DC | PRN

## 2013-11-30 MED ORDER — IOHEXOL 300 MG/ML  SOLN
25.0000 mL | Freq: Once | INTRAMUSCULAR | Status: AC | PRN
Start: 2013-11-30 — End: 2013-11-30
  Administered 2013-11-30: 25 mL via ORAL

## 2013-11-30 MED ORDER — IOHEXOL 300 MG/ML  SOLN
25.0000 mL | Freq: Once | INTRAMUSCULAR | Status: AC | PRN
  Administered 2013-11-30: 25 mL via ORAL

## 2013-11-30 MED ORDER — IOHEXOL 300 MG/ML  SOLN
100.0000 mL | Freq: Once | INTRAMUSCULAR | Status: AC | PRN
  Administered 2013-11-30: 100 mL via INTRAVENOUS

## 2013-11-30 NOTE — Telephone Encounter (Signed)
RLQ pain and fever began 2 d ago Had colonoscopy 10/19  In Dr. Ethel Rana office Rec: to ED

## 2013-11-30 NOTE — Progress Notes (Signed)
Pre visit review using our clinic review tool, if applicable. No additional management support is needed unless otherwise documented below in the visit note. 

## 2013-11-30 NOTE — ED Provider Notes (Signed)
Medical screening examination/treatment/procedure(s) were conducted as a shared visit with non-physician practitioner(s) and myself.  I personally evaluated the patient during the encounter.   EKG Interpretation None      Results for orders placed during the hospital encounter of 11/30/13  URINALYSIS, ROUTINE W REFLEX MICROSCOPIC      Result Value Ref Range   Color, Urine AMBER (*) YELLOW   APPearance CLEAR  CLEAR   Specific Gravity, Urine 1.030  1.005 - 1.030   pH 5.5  5.0 - 8.0   Glucose, UA NEGATIVE  NEGATIVE mg/dL   Hgb urine dipstick LARGE (*) NEGATIVE   Bilirubin Urine SMALL (*) NEGATIVE   Ketones, ur 15 (*) NEGATIVE mg/dL   Protein, ur 30 (*) NEGATIVE mg/dL   Urobilinogen, UA 1.0  0.0 - 1.0 mg/dL   Nitrite NEGATIVE  NEGATIVE   Leukocytes, UA NEGATIVE  NEGATIVE  CBC WITH DIFFERENTIAL      Result Value Ref Range   WBC 6.3  4.0 - 10.5 K/uL   RBC 4.22  4.22 - 5.81 MIL/uL   Hemoglobin 13.7  13.0 - 17.0 g/dL   HCT 41.9  39.0 - 52.0 %   MCV 99.3  78.0 - 100.0 fL   MCH 32.5  26.0 - 34.0 pg   MCHC 32.7  30.0 - 36.0 g/dL   RDW 13.0  11.5 - 15.5 %   Platelets 210  150 - 400 K/uL   Neutrophils Relative % 63  43 - 77 %   Neutro Abs 4.0  1.7 - 7.7 K/uL   Lymphocytes Relative 24  12 - 46 %   Lymphs Abs 1.5  0.7 - 4.0 K/uL   Monocytes Relative 13 (*) 3 - 12 %   Monocytes Absolute 0.8  0.1 - 1.0 K/uL   Eosinophils Relative 0  0 - 5 %   Eosinophils Absolute 0.0  0.0 - 0.7 K/uL   Basophils Relative 0  0 - 1 %   Basophils Absolute 0.0  0.0 - 0.1 K/uL  COMPREHENSIVE METABOLIC PANEL      Result Value Ref Range   Sodium 138  137 - 147 mEq/L   Potassium 4.1  3.7 - 5.3 mEq/L   Chloride 100  96 - 112 mEq/L   CO2 24  19 - 32 mEq/L   Glucose, Bld 94  70 - 99 mg/dL   BUN 14  6 - 23 mg/dL   Creatinine, Ser 0.90  0.50 - 1.35 mg/dL   Calcium 9.3  8.4 - 10.5 mg/dL   Total Protein 7.3  6.0 - 8.3 g/dL   Albumin 3.9  3.5 - 5.2 g/dL   AST 32  0 - 37 U/L   ALT 27  0 - 53 U/L   Alkaline  Phosphatase 69  39 - 117 U/L   Total Bilirubin 0.3  0.3 - 1.2 mg/dL   GFR calc non Af Amer >90  >90 mL/min   GFR calc Af Amer >90  >90 mL/min   Anion gap 14  5 - 15  LIPASE, BLOOD      Result Value Ref Range   Lipase 26  11 - 59 U/L  URINE MICROSCOPIC-ADD ON      Result Value Ref Range   Squamous Epithelial / LPF RARE  RARE   RBC / HPF 11-20  <3 RBC/hpf   Bacteria, UA RARE  RARE   Urine-Other MUCOUS PRESENT    I-STAT CG4 LACTIC ACID, ED      Result Value  Ref Range   Lactic Acid, Venous 0.99  0.5 - 2.2 mmol/L   Dg Chest 2 View  11/30/2013   CLINICAL DATA:  Headache. Cough. Low back pain. Initial encounter.  EXAM: CHEST  2 VIEW  COMPARISON:  12/22/2006.  FINDINGS: Cardiopericardial silhouette within normal limits. Mediastinal contours normal. Trachea midline. No airspace disease or effusion. Cholecystectomy clips are present in the right upper quadrant.  IMPRESSION: No active cardiopulmonary disease.   Electronically Signed   By: Dereck Ligas M.D.   On: 11/30/2013 13:37   Ct Abdomen Pelvis W Contrast  11/30/2013   CLINICAL DATA:  Patient with right flank pain and fever for 2 days. Status post colonoscopy 4 days ago. History of hepatitis and ulcerative colitis.  EXAM: CT ABDOMEN AND PELVIS WITH CONTRAST  TECHNIQUE: Multidetector CT imaging of the abdomen and pelvis was performed using the standard protocol following bolus administration of intravenous contrast.  CONTRAST:  51m OMNIPAQUE IOHEXOL 300 MG/ML SOLN, 1043mOMNIPAQUE IOHEXOL 300 MG/ML SOLN, 2554mMNIPAQUE IOHEXOL 300 MG/ML SOLN  COMPARISON:  CT, 10/25/2004.  FINDINGS: There are no renal or ureteral stones. There is no hydronephrosis. Bilateral renal sinus cysts have increased in size from the prior CT. No solid renal masses. Ureters are normal in course and in caliber. Bladder is unremarkable.  Lung bases are essentially clear.  Heart is normal in size.  Liver, spleen, pancreas, adrenal glands: Unremarkable. Gallbladder surgically  absent.  No pathologically enlarged lymph nodes. No abnormal fluid collections.  Colon and small bowel are unremarkable. Normal appendix is visualized.  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 There are degenerative changes noted along the lumbar spine. SI joints appear partly fused. No osteoblastic or osteolytic lesions.  IMPRESSION: 1. No acute findings. No findings to explain right flank pain or fever. 2. Chronic findings include changes from a cholecystectomy, bilateral renal sinus cysts and chronic bilateral pars defects at L5-S1 with a grade 1 anterolisthesis.   Electronically Signed   By: DavLajean ManesD.   On: 11/30/2013 14:37      Patient seen by me. Also it discussed with his primary care doctor upstairs about the concerns for complication from the colonoscopy that was done on Monday. Patient subjectively had some fevers. No documented fevers here. Patient's had some right flank pain since Wednesday he can have more in the back area. Patient's also been having some trouble sleeping. CT scan done here to rule out any complication of the colonoscopy. No evidence of that. No evidence of perforation. Patient here very stable labs without any significant abnormalities. Other than some slight hematuria but no direct evidence of a urinary tract infection. Patient we discharged home with close followup. Patient's abdomen was soft nontender normal bowel sounds.  ScoFredia SorrowD 11/30/13 1517

## 2013-11-30 NOTE — ED Notes (Signed)
Right flank pain since Wednesday. Hx of colonoscopy on Monday. Fever.

## 2013-11-30 NOTE — Discharge Instructions (Signed)
For fever and pain control, please take Ibuprofen (also known as Motrin or Advil) 435m (this is normally 2 over the counter pills) every 6 hours. Take with food to minimize stomach irritation.  Push fluids: take small frequent sips of water or Gatorade, do not drink any soda, juice or caffeinated beverages.    You can also take  tylenol (acetaminophen) 9741m(this is 3 over the counter pills) four times a day. Do not drink alcohol or combine with other medications that have acetaminophen as an ingredient (Read the labels!).    For breakthrough pain you may take Robaxin. Do not drink alcohol, drive or operate heavy machinery when taking Robaxin.

## 2013-11-30 NOTE — Patient Instructions (Signed)
Go to the ER for further evaluation

## 2013-11-30 NOTE — Progress Notes (Signed)
Subjective:    Patient ID: Isaiah Miller, male    DOB: 04/07/1955, 58 y.o.   MRN: [id number]  DOS:  11/30/2013 Type of visit - description : acute Interval history: Had a colonoscopy 11/26/2013 with multiple random biopsies. The next day he developed ill-defined right-sided abdominal pain, steady, not on-off. He started to feel feverish, last night his temperature was 101.0, he took some advil. At this point in the abdominal pain is decreased but he has right flank pain. He has also noticed a lack of appetite, however when he tries to eat he tolerates food. Some early satiety.   ROS Denies nausea, vomiting, diarrhea, last bowel movement was last night and looked normal. No blood in the stools. Mild cough No rash No dysuria, gross hematuria or difficulty urinating  Past Medical History  Diagnosis Date  . Ulcerative colitis     dx 1987 left and then more universal  . Hepatitis     ? EtOH  . GERD (gastroesophageal reflux disease)   . Vitamin D deficiency     Past Surgical History  Procedure Laterality Date  . Cholecystectomy    . Colonoscopy w/ biopsies  05/2006, 03/2009    ulcerative colitis left and right, no dysplasia  . Knee surgery    . Colonoscopy      History   Social History  . Marital Status: Married    Spouse Name: N/A    Number of Children: 3  . Years of Education: N/A   Occupational History  . Welder   .     Social History Main Topics  . Smoking status: Never Smoker   . Smokeless tobacco: Never Used  . Alcohol Use: Yes     Comment: occassional   . Drug Use: No  . Sexual Activity: Yes     Comment: married   Other Topics Concern  . Not on file   Social History Narrative   Occasional caffeine         Medication List       This list is accurate as of: 11/30/13 12:04 PM.  Always use your most recent med list.               ADVIL PO  Take 800 mg by mouth as needed.     ramelteon 8 MG tablet  Commonly known as:  ROZEREM  Take 1  tablet (8 mg total) by mouth at bedtime.     ranitidine 150 MG tablet  Commonly known as:  ZANTAC  Take 150 mg by mouth as needed.     sulfaSALAzine 500 MG tablet  Commonly known as:  AZULFIDINE  Take 500 mg by mouth 4 (four) times daily. Take 4 tablets by mouth twice daily.           Objective:   Physical Exam BP 119/81  Pulse 91  Temp(Src) 98.7 F (37.1 C) (Oral)  Wt 224 lb 2 oz (101.662 kg)  SpO2 96%  General -- alert, well-developed, NAD.    Lungs -- normal respiratory effort, no intercostal retractions, no accessory muscle use, and normal breath sounds.  Heart-- normal rate, regular rhythm, no murmur.  Abdomen-- Not distended, good bowel sounds,soft, ++ tender RLQ, mild rebound?.  No mass,organomegaly. + CVA tenderness   Extremities-- no pretibial edema bilaterally   Psych-- Cognition and judgment appear intact. Cooperative with normal attention span and concentration. No anxious or depressed appearing.       Assessment & Plan:  Abdominal-flank  pain  58 year old gentleman with abdominal pain and fever shortly after a colonoscopy. On exam he is tender in the right lower quadrant with question of  rebound. Udip-- some blood, symptoms were not consistent with acute ureteral stone. I discussed the case with GI, he needs further evaluation, we agreed to send him to the ER for w/u, likely needs a CT, CBC, BMP etc. UA and UCX sent  Case discussed with the ER doctor as well, Dr Carlean Purl available at his pager today

## 2013-11-30 NOTE — ED Provider Notes (Signed)
CSN: [id number]     Arrival date & time 11/30/13  1153 History   First MD Initiated Contact with Patient 11/30/13 1227     Chief Complaint  Patient presents with  . Back Pain     (Consider location/radiation/quality/duration/timing/severity/associated sxs/prior Treatment) HPI  Isaiah Miller is a 58 y.o. male with past medical history is significant for ulcerative colitis, hepatitis he is status post colonoscopy by Carlean Purl 5 days ago, complaining of right flank pain onset 3 days ago not alleviated with ibuprofen or sulfasalazine, associated with fever (MAXIMUM TEMPERATURE 101) nausea. Patient denies change in stool, vomiting, headache, rash, chest pain, shortness of breath, or rhinorrhea, tick bite, history of kidney stone. On review of systems he endorses nasal congestion and productive cough and states that he feels like his face is burning.   Past Medical History  Diagnosis Date  . Ulcerative colitis     dx 1987 left and then more universal  . Hepatitis     ? EtOH  . GERD (gastroesophageal reflux disease)   . Vitamin D deficiency    Past Surgical History  Procedure Laterality Date  . Cholecystectomy    . Colonoscopy w/ biopsies  05/2006, 03/2009    ulcerative colitis left and right, no dysplasia  . Knee surgery    . Colonoscopy     Family History  Problem Relation Age of Onset  . Colon cancer Neg Hx   . Rectal cancer Neg Hx   . Stomach cancer Neg Hx   . Diabetes Mother    History  Substance Use Topics  . Smoking status: Never Smoker   . Smokeless tobacco: Never Used  . Alcohol Use: Yes     Comment: occassional     Review of Systems  10 systems reviewed and found to be negative, except as noted in the HPI.  Allergies  Review of patient's allergies indicates no known allergies.  Home Medications   Prior to Admission medications   Medication Sig Start Date End Date Taking? Authorizing Provider  Ibuprofen (ADVIL PO) Take 800 mg by mouth as needed.     Historical Provider, MD  methocarbamol (ROBAXIN) 500 MG tablet Take 2 tablets (1,000 mg total) by mouth 4 (four) times daily as needed (Pain). 11/30/13   Maurizio Geno, PA-C  ramelteon (ROZEREM) 8 MG tablet Take 1 tablet (8 mg total) by mouth at bedtime. 11/30/13   Barnett Elzey, PA-C  ranitidine (ZANTAC) 150 MG tablet Take 150 mg by mouth as needed.      Historical Provider, MD  sulfaSALAzine (AZULFIDINE) 500 MG tablet Take 500 mg by mouth 4 (four) times daily. Take 4 tablets by mouth twice daily.    Historical Provider, MD   BP 122/76  Pulse 89  Temp(Src) 97.9 F (36.6 C) (Oral)  Resp 18  Ht 5' 9.5" (1.765 m)  Wt 224 lb (101.606 kg)  BMI 32.62 kg/m2  SpO2 99% Physical Exam  Nursing note and vitals reviewed. Constitutional: He is oriented to person, place, and time. He appears well-developed and well-nourished. No distress.  HENT:  Head: Normocephalic and atraumatic.  Geographic tongue  Eyes: Conjunctivae and EOM are normal.  Cardiovascular: Normal rate, regular rhythm and intact distal pulses.   Pulmonary/Chest: Effort normal and breath sounds normal. No stridor.  Abdominal: Soft. Bowel sounds are normal. He exhibits no distension and no mass. There is tenderness. There is no rebound and no guarding.    Genitourinary:  No focal CVA tenderness, patient is diffusely tender to  palpation from the right iliac crest to the right costovertebral angle.  Musculoskeletal: Normal range of motion.       Back:  Neurological: He is alert and oriented to person, place, and time.  Psychiatric: He has a normal mood and affect.    ED Course  Procedures (including critical care time) Labs Review Labs Reviewed  URINALYSIS, ROUTINE W REFLEX MICROSCOPIC - Abnormal; Notable for the following:    Color, Urine AMBER (*)    Hgb urine dipstick LARGE (*)    Bilirubin Urine SMALL (*)    Ketones, ur 15 (*)    Protein, ur 30 (*)    All other components within normal limits  CBC WITH  DIFFERENTIAL - Abnormal; Notable for the following:    Monocytes Relative 13 (*)    All other components within normal limits  CULTURE, BLOOD (ROUTINE X 2)  CULTURE, BLOOD (ROUTINE X 2)  URINE CULTURE  COMPREHENSIVE METABOLIC PANEL  LIPASE, BLOOD  URINE MICROSCOPIC-ADD ON  I-STAT CG4 LACTIC ACID, ED    Imaging Review Dg Chest 2 View  11/30/2013   CLINICAL DATA:  Headache. Cough. Low back pain. Initial encounter.  EXAM: CHEST  2 VIEW  COMPARISON:  12/22/2006.  FINDINGS: Cardiopericardial silhouette within normal limits. Mediastinal contours normal. Trachea midline. No airspace disease or effusion. Cholecystectomy clips are present in the right upper quadrant.  IMPRESSION: No active cardiopulmonary disease.   Electronically Signed   By: Dereck Ligas M.D.   On: 11/30/2013 13:37   Ct Abdomen Pelvis W Contrast  11/30/2013   CLINICAL DATA:  Patient with right flank pain and fever for 2 days. Status post colonoscopy 4 days ago. History of hepatitis and ulcerative colitis.  EXAM: CT ABDOMEN AND PELVIS WITH CONTRAST  TECHNIQUE: Multidetector CT imaging of the abdomen and pelvis was performed using the standard protocol following bolus administration of intravenous contrast.  CONTRAST:  66m OMNIPAQUE IOHEXOL 300 MG/ML SOLN, 1052mOMNIPAQUE IOHEXOL 300 MG/ML SOLN, 2513mMNIPAQUE IOHEXOL 300 MG/ML SOLN  COMPARISON:  CT, 10/25/2004.  FINDINGS: There are no renal or ureteral stones. There is no hydronephrosis. Bilateral renal sinus cysts have increased in size from the prior CT. No solid renal masses. Ureters are normal in course and in caliber. Bladder is unremarkable.  Lung bases are essentially clear.  Heart is normal in size.  Liver, spleen, pancreas, adrenal glands: Unremarkable. Gallbladder surgically absent.  No pathologically enlarged lymph nodes. No abnormal fluid collections.  Colon and small bowel are unremarkable. Normal appendix is visualized.  Bilateral chronic pars defects at L5-S1 with a  grade 1 anterolisthesis. There are degenerative changes noted along the lumbar spine. SI joints appear partly fused. No osteoblastic or osteolytic lesions.  IMPRESSION: 1. No acute findings. No findings to explain right flank pain or fever. 2. Chronic findings include changes from a cholecystectomy, bilateral renal sinus cysts and chronic bilateral pars defects at L5-S1 with a grade 1 anterolisthesis.   Electronically Signed   By: DavLajean ManesD.   On: 11/30/2013 14:37     EKG Interpretation None      MDM   Final diagnoses:  Cough  Right flank discomfort  Fever, unspecified fever cause  Insomnia    Filed Vitals:   11/30/13 1203 11/30/13 1425 11/30/13 1553  BP: 131/103 116/79 122/76  Pulse: 93 95 89  Temp: 98.3 F (36.8 C) 98.3 F (36.8 C) 97.9 F (36.6 C)  TempSrc: Oral Oral Oral  Resp: 20 20 18   Height: 5' 9.5" (  1.765 m)    Weight: 224 lb (101.606 kg)    SpO2: 100% 100% 99%    Medications  acetaminophen (TYLENOL) tablet 1,000 mg (1,000 mg Oral Given 11/30/13 1338)  sodium chloride 0.9 % bolus 1,000 mL (0 mLs Intravenous Stopped 11/30/13 1448)  ondansetron (ZOFRAN) injection 4 mg (4 mg Intravenous Given 11/30/13 1338)  iohexol (OMNIPAQUE) 300 MG/ML solution 25 mL (25 mLs Oral Contrast Given 11/30/13 1412)  iohexol (OMNIPAQUE) 300 MG/ML solution 100 mL (100 mLs Intravenous Contrast Given 11/30/13 1412)  iohexol (OMNIPAQUE) 300 MG/ML solution 25 mL (25 mLs Oral Contrast Given 11/30/13 1412)    Isaiah Miller is a 58 y.o. male presenting with fever, right flank and left lower cord for pain. Patient with recent colonoscopy 5 days ago. She is tolerating by mouth's with normal bowel movements. Normal vital signs here. Abdominal exam is nonsurgical, mild tenderness in the left lower cord, normal active bowel sounds. She is diffusely tender to palpation along the right flank, likely musculoskeletal, no focal CVA tenderness. Urinalysis does have a large amount of hemoglobin. CT  abdomen pelvis with contrast is ordered. Patient with productive cough, chest x-ray also ordered. Patient will be given liter bolus, driving home which limits pain medication.  CT and chest x-ray without acute abnormalities. Blood cultures and urine cultures are drawn. Patient is requesting insomnia medication. Serial abdominal exams remain benign.  This is a shared visit with the attending physician who personally evaluated the patient and agrees with the care plan.    Evaluation does not show pathology that would require ongoing emergent intervention or inpatient treatment. Pt is hemodynamically stable and mentating appropriately. Discussed findings and plan with patient/guardian, who agrees with care plan. All questions answered. Return precautions discussed and outpatient follow up given.   Discharge Medication List as of 11/30/2013  3:03 PM    START taking these medications   Details  methocarbamol (ROBAXIN) 500 MG tablet Take 2 tablets (1,000 mg total) by mouth 4 (four) times daily as needed (Pain)., Starting 11/30/2013, Until Discontinued, Print    ramelteon (ROZEREM) 8 MG tablet Take 1 tablet (8 mg total) by mouth at bedtime., Starting 11/30/2013, Until Discontinued, [employer], PA-C 11/30/13 1650

## 2013-12-01 LAB — URINE CULTURE
COLONY COUNT: NO GROWTH
Colony Count: NO GROWTH
Culture: NO GROWTH
ORGANISM ID, BACTERIA: NO GROWTH

## 2013-12-03 ENCOUNTER — Encounter: Payer: Self-pay | Admitting: Internal Medicine

## 2013-12-03 NOTE — Telephone Encounter (Signed)
Reviewed ED notes and negative CT  We will contact him to see how he is.

## 2013-12-03 NOTE — Telephone Encounter (Signed)
I spoke with Mrs. Wince.  She reports he is feeling better, no further c/o RLQ pain or fever.  She will have him call back for any new symptoms or concerns

## 2013-12-03 NOTE — Progress Notes (Signed)
Quick Note:  Colitis in remission Repeat colonoscopy 2018 ______

## 2013-12-06 LAB — CULTURE, BLOOD (ROUTINE X 2)
CULTURE: NO GROWTH
Culture: NO GROWTH

## 2014-04-07 ENCOUNTER — Other Ambulatory Visit: Payer: Self-pay | Admitting: Internal Medicine

## 2014-05-20 ENCOUNTER — Other Ambulatory Visit: Payer: Self-pay

## 2014-05-20 ENCOUNTER — Other Ambulatory Visit: Payer: Self-pay | Admitting: Internal Medicine

## 2014-05-20 ENCOUNTER — Telehealth: Payer: Self-pay | Admitting: Internal Medicine

## 2014-05-20 MED ORDER — SULFASALAZINE 500 MG PO TABS: 500.0000 mg | ORAL_TABLET | Freq: Four times a day (QID) | ORAL | Status: DC

## 2014-05-20 NOTE — Telephone Encounter (Signed)
Spoke with patient and refill sent to [facility] as requested.

## 2014-06-19 ENCOUNTER — Other Ambulatory Visit: Payer: Self-pay | Admitting: Internal Medicine

## 2014-08-16 ENCOUNTER — Other Ambulatory Visit: Payer: Self-pay | Admitting: Internal Medicine

## 2014-09-18 ENCOUNTER — Encounter: Payer: Self-pay | Admitting: Internal Medicine

## 2014-10-09 ENCOUNTER — Other Ambulatory Visit: Payer: Self-pay | Admitting: Internal Medicine

## 2014-10-18 ENCOUNTER — Encounter: Payer: Self-pay | Admitting: Internal Medicine

## 2014-12-02 ENCOUNTER — Other Ambulatory Visit: Payer: Self-pay

## 2014-12-02 ENCOUNTER — Other Ambulatory Visit: Payer: Self-pay | Admitting: Internal Medicine

## 2014-12-02 MED ORDER — SULFASALAZINE 500 MG PO TABS: 2000.0000 mg | ORAL_TABLET | Freq: Two times a day (BID) | ORAL | Status: DC

## 2015-01-22 ENCOUNTER — Other Ambulatory Visit: Payer: Self-pay

## 2015-01-22 MED ORDER — SULFASALAZINE 500 MG PO TABS: 2000.0000 mg | ORAL_TABLET | Freq: Two times a day (BID) | ORAL | Status: DC

## 2015-01-22 NOTE — Telephone Encounter (Signed)
Refilled patients sulfasalazine and noted on rx that office visit needed.

## 2015-01-27 ENCOUNTER — Telehealth: Payer: Self-pay | Admitting: Internal Medicine

## 2015-01-27 MED ORDER — SULFASALAZINE 500 MG PO TABS: 2000.0000 mg | ORAL_TABLET | Freq: Two times a day (BID) | ORAL | Status: DC

## 2015-01-27 NOTE — Telephone Encounter (Signed)
90 supply sent in as patient requested.

## 2015-03-28 ENCOUNTER — Encounter: Payer: Self-pay | Admitting: Internal Medicine

## 2015-03-28 ENCOUNTER — Ambulatory Visit (INDEPENDENT_AMBULATORY_CARE_PROVIDER_SITE_OTHER): Payer: Commercial Managed Care - PPO | Admitting: Internal Medicine

## 2015-03-28 ENCOUNTER — Other Ambulatory Visit (INDEPENDENT_AMBULATORY_CARE_PROVIDER_SITE_OTHER): Payer: Commercial Managed Care - PPO

## 2015-03-28 VITALS — BP 132/80 | HR 88 | Ht 69.0 in | Wt 232.6 lb

## 2015-03-28 DIAGNOSIS — Z125 Encounter for screening for malignant neoplasm of prostate: Secondary | ICD-10-CM | POA: Insufficient documentation

## 2015-03-28 DIAGNOSIS — Z79899 Other long term (current) drug therapy: Secondary | ICD-10-CM

## 2015-03-28 DIAGNOSIS — K5100 Ulcerative (chronic) pancolitis without complications: Secondary | ICD-10-CM

## 2015-03-28 DIAGNOSIS — E559 Vitamin D deficiency, unspecified: Secondary | ICD-10-CM

## 2015-03-28 DIAGNOSIS — Z0000 Encounter for general adult medical examination without abnormal findings: Secondary | ICD-10-CM | POA: Insufficient documentation

## 2015-03-28 LAB — CBC WITH DIFFERENTIAL/PLATELET
Basophils Absolute: 0 10*3/uL (ref 0.0–0.1)
Basophils Relative: 0.5 % (ref 0.0–3.0)
EOS PCT: 1.5 % (ref 0.0–5.0)
Eosinophils Absolute: 0.1 10*3/uL (ref 0.0–0.7)
HCT: 43.2 % (ref 39.0–52.0)
Hemoglobin: 14.3 g/dL (ref 13.0–17.0)
Lymphocytes Relative: 30.7 % (ref 12.0–46.0)
Lymphs Abs: 2.7 10*3/uL (ref 0.7–4.0)
MCHC: 33.1 g/dL (ref 30.0–36.0)
MCV: 96.5 fl (ref 78.0–100.0)
MONOS PCT: 10.3 % (ref 3.0–12.0)
Monocytes Absolute: 0.9 10*3/uL (ref 0.1–1.0)
NEUTROS ABS: 4.9 10*3/uL (ref 1.4–7.7)
NEUTROS PCT: 57 % (ref 43.0–77.0)
Platelets: 295 10*3/uL (ref 150.0–400.0)
RBC: 4.47 Mil/uL (ref 4.22–5.81)
RDW: 13.8 % (ref 11.5–15.5)
WBC: 8.7 10*3/uL (ref 4.0–10.5)

## 2015-03-28 LAB — COMPREHENSIVE METABOLIC PANEL WITH GFR
ALT: 21 U/L (ref 0–53)
AST: 18 U/L (ref 0–37)
Albumin: 4.4 g/dL (ref 3.5–5.2)
Alkaline Phosphatase: 67 U/L (ref 39–117)
BUN: 13 mg/dL (ref 6–23)
CO2: 25 meq/L (ref 19–32)
Calcium: 9.7 mg/dL (ref 8.4–10.5)
Chloride: 104 meq/L (ref 96–112)
Creatinine, Ser: 0.9 mg/dL (ref 0.40–1.50)
GFR: 91.91 mL/min
Glucose, Bld: 99 mg/dL (ref 70–99)
Potassium: 4.3 meq/L (ref 3.5–5.1)
Sodium: 137 meq/L (ref 135–145)
Total Bilirubin: 0.4 mg/dL (ref 0.2–1.2)
Total Protein: 7.4 g/dL (ref 6.0–8.3)

## 2015-03-28 LAB — VITAMIN D 25 HYDROXY (VIT D DEFICIENCY, FRACTURES): VITD: 13.85 ng/mL — ABNORMAL LOW (ref 30.00–100.00)

## 2015-03-28 NOTE — Patient Instructions (Signed)
  Your physician has requested that you go to the basement for the following lab work before leaving today: CBC/diff, CMET, Vitamin D level    I appreciate the opportunity to care for you. Silvano Rusk, MD, [facility]

## 2015-03-28 NOTE — Assessment & Plan Note (Signed)
Doing well Continue sulfasalazine RTC about 1 year CBC, CMET today

## 2015-03-28 NOTE — Assessment & Plan Note (Signed)
Recheck level

## 2015-03-28 NOTE — Assessment & Plan Note (Signed)
CBC today

## 2015-03-28 NOTE — Progress Notes (Signed)
   Subjective:    Patient ID: Isaiah Miller, male    DOB: [birth year], 60 y.o.   MRN: [id number] Chief Complaint is follow-up of ulcerative colitis HPI Patient is here today without any complaints. He has chronic ulcerative colitis that was in remission on his last and prior colonoscopy last was in 2015. There is also a history of vitamin D deficiency. He is on sulfasalazine. He had some febrile episode and pain after his colonoscopy in October 2015. He has not needed to see a physician since.  Medications, allergies, past medical history, past surgical history, family history and social history are reviewed and updated in the EMR.  Review of Systems As above otherwise negative    Objective:   Physical Exam @BP  132/80 mmHg  Pulse 88  Ht 5' 9"  (1.753 m)  Wt 232 lb 9.6 oz (105.507 kg)  BMI 34.33 kg/m2@  General:  NAD Eyes:   anicteric Lungs:  clear Heart:: S1S2 no rubs, murmurs or gallops Abdomen:  soft and nontender, BS+   Data Reviewed:  As per history of present illness     Assessment & Plan:  Chronic universal ulcerative colitis ([place]) Doing well Continue sulfasalazine RTC about 1 year CBC, CMET today  Vitamin D deficiency Recheck level  Long term use of sulfasalazine CBC today

## 2015-03-31 NOTE — Progress Notes (Signed)
Quick Note:  Ask him to recheck level in 4 months ______

## 2015-03-31 NOTE — Progress Notes (Signed)
Quick Note:  CBC and CMET are fine Vit D remains low He should take 50k U weekly x 18 weeks  Start 1000 IU daily now ______

## 2015-04-01 ENCOUNTER — Other Ambulatory Visit: Payer: Self-pay

## 2015-04-01 DIAGNOSIS — E559 Vitamin D deficiency, unspecified: Secondary | ICD-10-CM

## 2015-04-01 MED ORDER — VITAMIN D (ERGOCALCIFEROL) 1.25 MG (50000 UNIT) PO CAPS: 50000.0000 [IU] | ORAL_CAPSULE | ORAL | Status: DC

## 2015-04-30 ENCOUNTER — Other Ambulatory Visit: Payer: Self-pay | Admitting: Internal Medicine

## 2016-05-24 ENCOUNTER — Other Ambulatory Visit: Payer: Self-pay | Admitting: Internal Medicine

## 2016-08-24 ENCOUNTER — Other Ambulatory Visit: Payer: Self-pay | Admitting: Internal Medicine

## 2016-08-24 NOTE — Telephone Encounter (Signed)
Left patient message to call me to set up appointment. Last seen 03/2015.

## 2016-08-25 NOTE — Telephone Encounter (Signed)
Spoke with wife Isaiah Miller and updated patient's phone numbers.  Called and left a message for him to call me back tomorrow.  His wife said he thought he didn't have to come back for 3 years.  I told her that is for the colonoscopy but he is due for an office visit.

## 2016-08-30 NOTE — Telephone Encounter (Signed)
Spoke with Tarkio and set him up for an appointment on 10/29/16 at 2:45pm.  Will send in his refill of his sulfasalazine to cover him until his appointment.

## 2016-10-29 ENCOUNTER — Other Ambulatory Visit (INDEPENDENT_AMBULATORY_CARE_PROVIDER_SITE_OTHER): Payer: Commercial Managed Care - PPO

## 2016-10-29 ENCOUNTER — Ambulatory Visit (INDEPENDENT_AMBULATORY_CARE_PROVIDER_SITE_OTHER): Payer: Commercial Managed Care - PPO | Admitting: Internal Medicine

## 2016-10-29 ENCOUNTER — Encounter: Payer: Self-pay | Admitting: Internal Medicine

## 2016-10-29 VITALS — BP 142/76 | HR 80 | Ht 69.0 in | Wt 238.4 lb

## 2016-10-29 DIAGNOSIS — K5100 Ulcerative (chronic) pancolitis without complications: Secondary | ICD-10-CM | POA: Diagnosis not present

## 2016-10-29 DIAGNOSIS — Z79899 Other long term (current) drug therapy: Secondary | ICD-10-CM | POA: Diagnosis not present

## 2016-10-29 DIAGNOSIS — E559 Vitamin D deficiency, unspecified: Secondary | ICD-10-CM

## 2016-10-29 DIAGNOSIS — F5101 Primary insomnia: Secondary | ICD-10-CM

## 2016-10-29 LAB — COMPREHENSIVE METABOLIC PANEL
ALT: 18 U/L (ref 0–53)
AST: 19 U/L (ref 0–37)
Albumin: 4.3 g/dL (ref 3.5–5.2)
Alkaline Phosphatase: 60 U/L (ref 39–117)
BILIRUBIN TOTAL: 0.4 mg/dL (ref 0.2–1.2)
BUN: 18 mg/dL (ref 6–23)
CO2: 26 meq/L (ref 19–32)
CREATININE: 1.03 mg/dL (ref 0.40–1.50)
Calcium: 9.4 mg/dL (ref 8.4–10.5)
Chloride: 105 mEq/L (ref 96–112)
GFR: 78.24 mL/min (ref >60.00)
GLUCOSE: 96 mg/dL (ref 70–99)
Potassium: 3.8 mEq/L (ref 3.5–5.1)
Sodium: 138 mEq/L (ref 135–145)
Total Protein: 7.1 g/dL (ref 6.0–8.3)

## 2016-10-29 LAB — CBC WITH DIFFERENTIAL/PLATELET
BASOS ABS: 0 10*3/uL (ref 0.0–0.1)
Basophils Relative: 0.5 % (ref 0.0–3.0)
EOS ABS: 0.1 10*3/uL (ref 0.0–0.7)
Eosinophils Relative: 1.3 % (ref 0.0–5.0)
HEMATOCRIT: 42.1 % (ref 39.0–52.0)
Hemoglobin: 13.8 g/dL (ref 13.0–17.0)
LYMPHS ABS: 2.7 10*3/uL (ref 0.7–4.0)
LYMPHS PCT: 28.2 % (ref 12.0–46.0)
MCHC: 32.8 g/dL (ref 30.0–36.0)
MCV: 100.6 fl — ABNORMAL HIGH (ref 78.0–100.0)
Monocytes Absolute: 0.9 10*3/uL (ref 0.1–1.0)
Monocytes Relative: 8.9 % (ref 3.0–12.0)
NEUTROS PCT: 61.1 % (ref 43.0–77.0)
Neutro Abs: 5.9 10*3/uL (ref 1.4–7.7)
PLATELETS: 249 10*3/uL (ref 150.0–400.0)
RBC: 4.19 Mil/uL — ABNORMAL LOW (ref 4.22–5.81)
RDW: 13.3 % (ref 11.5–15.5)
WBC: 9.7 10*3/uL (ref 4.0–10.5)

## 2016-10-29 LAB — VITAMIN D 25 HYDROXY (VIT D DEFICIENCY, FRACTURES): VITD: 21.42 ng/mL — ABNORMAL LOW (ref 30.00–100.00)

## 2016-10-29 MED ORDER — TRAZODONE HCL 50 MG PO TABS: 50.0000 mg | ORAL_TABLET | Freq: Every evening | ORAL | 2 refills | Status: DC | PRN

## 2016-10-29 MED ORDER — SULFASALAZINE 500 MG PO TABS: 2000.0000 mg | ORAL_TABLET | Freq: Two times a day (BID) | ORAL | 3 refills | Status: DC

## 2016-10-29 NOTE — Assessment & Plan Note (Signed)
Doing well Wants to hold off on colonoscopy - has been quiescent x years Refill sulfasalazine RTC 1 yr

## 2016-10-29 NOTE — Assessment & Plan Note (Signed)
rechcek

## 2016-10-29 NOTE — Patient Instructions (Signed)
If you are age 60 or older, your body mass index should be between 23-30. Your Body mass index is 35.2 kg/m. If this is out of the aforementioned range listed, please consider follow up with your Primary Care Provider.  If you are age 70 or younger, your body mass index should be between 19-25. Your Body mass index is 35.2 kg/m. If this is out of the aformentioned range listed, please consider follow up with your Primary Care Provider.   We have sent the following medications to your pharmacy for you to pick up at your convenience:  Sulfasalazine  Trazadone  Your physician has requested that you go to the basement for the following lab work before leaving today:  CBC, Cmet, Vitamin D  You will be due for a recall colonoscopy in October 2019. We will send you a reminder in the mail when it gets closer to that time.  Thank you.

## 2016-10-29 NOTE — Assessment & Plan Note (Signed)
labs

## 2016-10-29 NOTE — Progress Notes (Signed)
   Isaiah Miller 61 y.o. 22-Jun-1955 [id number]  Assessment & Plan:   Chronic universal ulcerative colitis ([place]) Doing well Wants to hold off on colonoscopy - has been quiescent x years Refill sulfasalazine RTC 1 yr  Long term use of sulfasalazine labs  Vitamin D deficiency rechcek     Subjective:   Chief Complaint:Follow-up of ulcerative colitis   HPI Isaiah Miller is here today without complaints with respect to the GI tract. History of ulcerative colitis multiple colonoscopies if showed it to be in complete remission, it was in 2015 on surveillance biopsies. He is not inclined to have another colonoscopy yet. He is having trouble with insomnia sometimes his wife's alprazolam is used in helps. Years ago he tried some Ambien and that gave him problems. Says he thinks about things a little but he says trouble initiating sleep. Not claiming any specific anxiety.  No Known Allergies Current Meds  Medication Sig  . Ibuprofen (ADVIL PO) Take 800 mg by mouth as needed.  . ranitidine (ZANTAC) 150 MG tablet Take 150 mg by mouth as needed.    . sulfaSALAzine (AZULFIDINE) 500 MG tablet Take 4 tablets (2,000 mg total) by mouth 2 (two) times daily.  . Vitamin D, Ergocalciferol, (DRISDOL) 50000 units CAPS capsule Take 1 capsule (50,000 Units total) by mouth every 7 (seven) days. For 18 weeks   Past Medical History:  Diagnosis Date  . GERD (gastroesophageal reflux disease)   . Hepatitis    ? EtOH  . Ulcerative colitis    dx 1987 left and then more universal  . Vitamin D deficiency    Past Surgical History:  Procedure Laterality Date  . CHOLECYSTECTOMY    . COLONOSCOPY W/ BIOPSIES  05/2006, 03/2009   ulcerative colitis left and right, no dysplasia  . KNEE SURGERY Right    Social History   Social History  . Marital status: Married    Spouse name: N/A  . Number of children: 3   Occupational History  . Welder   .  [employer]   Social History Main Topics  .  Smoking status: Never Smoker  . Smokeless tobacco: Never Used  . Alcohol use 0.0 oz/week     Comment: occasional   . Drug use: No  . Sexual activity: Yes     Comment: married   Other Topics Concern  . Not on file   Social History Narrative   Occasional caffeine    family history includes Diabetes in his mother.   Review of Systems As per history of present illness  Objective:   Physical Exam BP (!) 142/76 (BP Location: Left Arm, Patient Position: Sitting, Cuff Size: Normal)   Pulse 80   Ht 5' 9"  (1.753 m)   Wt 238 lb 6 oz (108.1 kg)   BMI 35.20 kg/m  Lungs clear Heart S1-S2 no rubs murmurs or gallops Abdomen is soft obese nontender Eyes are anicteric The examined area of the skin is without rash

## 2016-11-05 ENCOUNTER — Other Ambulatory Visit: Payer: Self-pay | Admitting: Internal Medicine

## 2016-11-07 NOTE — Progress Notes (Signed)
Labs are ok except vitamin D still low. Better than it was.  Would do 50K U vit D weekly x 8 weeks

## 2016-11-08 ENCOUNTER — Other Ambulatory Visit: Payer: Self-pay

## 2016-11-08 MED ORDER — VITAMIN D (ERGOCALCIFEROL) 1.25 MG (50000 UNIT) PO CAPS: 50000.0000 [IU] | ORAL_CAPSULE | ORAL | 0 refills | Status: DC

## 2017-01-19 ENCOUNTER — Other Ambulatory Visit: Payer: Self-pay | Admitting: Internal Medicine

## 2017-01-19 NOTE — Telephone Encounter (Signed)
I only Rxed x 1 - I think this is not needed

## 2017-01-19 NOTE — Telephone Encounter (Signed)
Do we need to refill this Sir? Thank you.

## 2017-02-03 ENCOUNTER — Other Ambulatory Visit: Payer: Self-pay | Admitting: Internal Medicine

## 2017-07-14 ENCOUNTER — Telehealth: Payer: Self-pay | Admitting: Family Medicine

## 2017-07-14 NOTE — Telephone Encounter (Signed)
Ok w/ me 

## 2017-07-14 NOTE — Telephone Encounter (Signed)
Pt wants to transfer care from Dr. Larose Kells to Dr. Zigmund Daniel. Please advise.     Copied from [place] [id number]. Topic: Inquiry >> Jul 14, 2017 12:25 PM Neva Seat wrote: Pt is wanting to transfer care from Dr. Larose Kells to establish care w/ Dr. Luetta Nutting. He is wanting to transfer to [facility] because the location is closer to him. Please call pt back to let him know and to set up a new pt appt if approved.

## 2017-07-14 NOTE — Telephone Encounter (Signed)
Ok with me 

## 2017-07-15 NOTE — Telephone Encounter (Signed)
I called and spoke with patient. Patient informed request to transfer was approved. Patient scheduled to see Dr. Luetta Nutting on 07/29/17 @ 8am.

## 2017-07-15 NOTE — Telephone Encounter (Signed)
Okay to schedule patient.

## 2017-07-29 ENCOUNTER — Encounter: Payer: Self-pay | Admitting: Family Medicine

## 2017-07-29 ENCOUNTER — Ambulatory Visit: Payer: Commercial Managed Care - PPO | Admitting: Family Medicine

## 2017-07-29 VITALS — BP 132/80 | HR 78 | Temp 97.7°F | Ht 69.0 in | Wt 237.6 lb

## 2017-07-29 DIAGNOSIS — F5101 Primary insomnia: Secondary | ICD-10-CM | POA: Diagnosis not present

## 2017-07-29 MED ORDER — TEMAZEPAM 15 MG PO CAPS: 15.0000 mg | ORAL_CAPSULE | Freq: Every evening | ORAL | 3 refills | Status: DC | PRN

## 2017-07-29 NOTE — Assessment & Plan Note (Signed)
Unsuccessful with trazodone and ambien previously Trial of restoril.  Reviewed that this medication has potential to be habit forming and Discussed limiting to nights where he has significant difficulty with sleep.   History of snoring as well, will send for sleep study.   I'll see him back in about 6 weeks.

## 2017-07-29 NOTE — Progress Notes (Signed)
Isaiah Miller - 62 y.o. male MRN [id number]  Date of birth: [birth year]  Subjective Chief Complaint  Patient presents with  . Insomnia    not being able to stay asleep, waking up around 2am and cant go back to sleep. Not caffeine/coffee    HPI Isaiah Miller is a 62 y.o. male with history of ulcerative colitis and insomnia here today with complaint of insomnia.  Has had insomnia for several years.  He is able to fall asleep but with difficulty staying asleep.  He will often wake up around 2-3 am and is unable to go back to sleep.  He denies any nocturia contributing to this.  He does admit to heavy snoring but if he is able to get a good nights sleep he feels refreshed in the morning.  He has not had any issues with daytime fatigue.  He denies depressive symptoms or excess caffeine intake.  He has tried trazodone in the past as high as 138m without response.  He has tried [place] as well but this didn't work very well for him either.  He has used his wife's alprazolam on occasion if he goes several days without sleeping which is helpful.    ROS: ROS completed and negative except as noted per HPI.   No Known Allergies  Past Medical History:  Diagnosis Date  . GERD (gastroesophageal reflux disease)   . Hepatitis    ? EtOH  . Ulcerative colitis    dx 1987 left and then more universal  . Vitamin D deficiency     Past Surgical History:  Procedure Laterality Date  . CHOLECYSTECTOMY    . COLONOSCOPY W/ BIOPSIES  05/2006, 03/2009   ulcerative colitis left and right, no dysplasia  . KNEE SURGERY Right     Social History   Socioeconomic History  . Marital status: Married    Spouse name: Not on file  . Number of children: 3  . Years of education: Not on file  . Highest education level: Not on file  Occupational History  . Occupation: [employer] . Financial resource strain: Not on file  . Food insecurity:    Worry:  Not on file    Inability: Not on file  . Transportation needs:    Medical: Not on file    Non-medical: Not on file  Tobacco Use  . Smoking status: Never Smoker  . Smokeless tobacco: Never Used  Substance and Sexual Activity  . Alcohol use: Yes    Alcohol/week: 0.0 oz    Comment: occasional   . Drug use: No  . Sexual activity: Yes    Comment: married  Lifestyle  . Physical activity:    Days per week: Not on file    Minutes per session: Not on file  . Stress: Not on file  Relationships  . Social connections:    Talks on phone: Not on file    Gets together: Not on file    Attends religious service: Not on file    Active member of club or organization: Not on file    Attends meetings of clubs or organizations: Not on file    Relationship status: Not on file  Other Topics Concern  . Not on file  Social History Narrative   Occasional caffeine     Family History  Problem Relation Age of Onset  . Diabetes Mother   . Colon cancer Neg Hx   .  Rectal cancer Neg Hx   . Stomach cancer Neg Hx   . Esophageal cancer Neg Hx   . Pancreatic cancer Neg Hx   . Colon polyps Neg Hx   . Heart disease Neg Hx   . Kidney disease Neg Hx   . Liver disease Neg Hx     Health Maintenance  Topic Date Due  . Hepatitis C Screening  Nov 08, 1955  . HIV Screening  08/08/1971  . TETANUS/TDAP  08/08/1975  . COLONOSCOPY  11/26/2016  . INFLUENZA VACCINE  09/08/2017    ----------------------------------------------------------------------------------------------------------------------------------------------------------------------------------------------------------------- Physical Exam BP 132/80 (BP Location: Left Arm, Patient Position: Sitting, Cuff Size: Normal)   Pulse 78   Temp 97.7 F (36.5 C) (Oral)   Ht 5' 9"  (1.753 m)   Wt 237 lb 9.6 oz (107.8 kg)   SpO2 92%   BMI 35.09 kg/m   Physical Exam  Constitutional: He is oriented to person, place, and time. He appears well-nourished. No  distress.  HENT:  Head: Normocephalic and atraumatic.  Mouth/Throat: Oropharynx is clear and moist.  Eyes: No scleral icterus.  Neck: Neck supple. No thyromegaly present.  Cardiovascular: Normal rate, regular rhythm and normal heart sounds.  Pulmonary/Chest: Effort normal and breath sounds normal.  Lymphadenopathy:    He has no cervical adenopathy.  Neurological: He is alert and oriented to person, place, and time. No cranial nerve deficit. Coordination normal.  Psychiatric: He has a normal mood and affect. His behavior is normal.    ------------------------------------------------------------------------------------------------------------------------------------------------------------------------------------------------------------------- Assessment and Plan  Primary insomnia Unsuccessful with trazodone and ambien previously Trial of restoril.  Reviewed that this medication has potential to be habit forming and Discussed limiting to nights where he has significant difficulty with sleep.   History of snoring as well, will send for sleep study.   I'll see him back in about 6 weeks.

## 2017-07-29 NOTE — Patient Instructions (Signed)

## 2017-09-09 ENCOUNTER — Encounter: Payer: Self-pay | Admitting: Family Medicine

## 2017-09-09 ENCOUNTER — Ambulatory Visit: Payer: Commercial Managed Care - PPO | Admitting: Family Medicine

## 2017-09-09 VITALS — BP 130/82 | HR 85 | Temp 98.3°F | Ht 69.0 in | Wt 239.2 lb

## 2017-09-09 DIAGNOSIS — F5101 Primary insomnia: Secondary | ICD-10-CM | POA: Diagnosis not present

## 2017-09-09 MED ORDER — TEMAZEPAM 30 MG PO CAPS: 30.0000 mg | ORAL_CAPSULE | Freq: Every evening | ORAL | 5 refills | Status: DC | PRN

## 2017-09-09 NOTE — Progress Notes (Signed)
Isaiah Miller - 62 y.o. male MRN [id number]  Date of birth: April 05, 1955  Subjective Chief Complaint  Patient presents with  . Follow-up    F/U for insomnia would like to maybe increase dose    HPI Isaiah Miller is a 62 y.o. male here today for f/u of insomnia.  Started on restoril at previous appt.  Has had some improvement but still having difficulty sleeping.  Would like to increase dose if possible.  Denies side effects including morning grogginess.  Has upcoming appt with neurology as well to discuss sleep study.    ROS:  A comprehensive ROS was completed and negative except as noted per HPI  No Known Allergies  Past Medical History:  Diagnosis Date  . GERD (gastroesophageal reflux disease)   . Hepatitis    ? EtOH  . Ulcerative colitis    dx 1987 left and then more universal  . Vitamin D deficiency     Past Surgical History:  Procedure Laterality Date  . CHOLECYSTECTOMY    . COLONOSCOPY W/ BIOPSIES  05/2006, 03/2009   ulcerative colitis left and right, no dysplasia  . KNEE SURGERY Right     Social History   Socioeconomic History  . Marital status: Married    Spouse name: Not on file  . Number of children: 3  . Years of education: Not on file  . Highest education level: Not on file  Occupational History  . Occupation: Company secretary: [employer]  . Financial resource strain: Not on file  . Food insecurity:    Worry: Not on file    Inability: Not on file  . Transportation needs:    Medical: Not on file    Non-medical: Not on file  Tobacco Use  . Smoking status: Never Smoker  . Smokeless tobacco: Never Used  Substance and Sexual Activity  . Alcohol use: Yes    Alcohol/week: 0.0 oz    Comment: occasional   . Drug use: No  . Sexual activity: Yes    Comment: married  Lifestyle  . Physical activity:    Days per week: Not on file    Minutes per session: Not on file  . Stress: Not on file  Relationships  . Social  connections:    Talks on phone: Not on file    Gets together: Not on file    Attends religious service: Not on file    Active member of club or organization: Not on file    Attends meetings of clubs or organizations: Not on file    Relationship status: Not on file  Other Topics Concern  . Not on file  Social History Narrative   Occasional caffeine     Family History  Problem Relation Age of Onset  . Diabetes Mother   . Colon cancer Neg Hx   . Rectal cancer Neg Hx   . Stomach cancer Neg Hx   . Esophageal cancer Neg Hx   . Pancreatic cancer Neg Hx   . Colon polyps Neg Hx   . Heart disease Neg Hx   . Kidney disease Neg Hx   . Liver disease Neg Hx     Health Maintenance  Topic Date Due  . Hepatitis C Screening  [birth year]  . HIV Screening  08/08/1971  . TETANUS/TDAP  08/08/1975  . COLONOSCOPY  11/26/2016  . INFLUENZA VACCINE  09/08/2017    ----------------------------------------------------------------------------------------------------------------------------------------------------------------------------------------------------------------- Physical Exam BP 130/82 (BP Location: Left Arm, Patient  Position: Sitting, Cuff Size: Normal)   Pulse 85   Temp 98.3 F (36.8 C) (Oral)   Ht 5' 9"  (1.753 m)   Wt 239 lb 3.2 oz (108.5 kg)   SpO2 96%   BMI 35.32 kg/m   Physical Exam  Constitutional: He is oriented to person, place, and time. He appears well-nourished. No distress.  HENT:  Head: Normocephalic and atraumatic.  Eyes: Pupils are equal, round, and reactive to light. EOM are normal.  Cardiovascular: Normal rate, regular rhythm and normal heart sounds.  Pulmonary/Chest: Effort normal and breath sounds normal.  Neurological: He is alert and oriented to person, place, and time. No cranial nerve deficit. Coordination normal.  Skin: Skin is warm and dry. No rash noted.  Psychiatric: He has a normal mood and affect. His behavior is normal.     ------------------------------------------------------------------------------------------------------------------------------------------------------------------------------------------------------------------- Assessment and Plan  Primary insomnia Some improvement with restoril 57m but overall remains uncontrolled.  Increase restoril to 324m   Keep appt for sleep study F/u in 3-4 months.

## 2017-09-09 NOTE — Assessment & Plan Note (Signed)
Some improvement with restoril 69m but overall remains uncontrolled.  Increase restoril to 376m   Keep appt for sleep study F/u in 3-4 months.

## 2017-09-09 NOTE — Patient Instructions (Signed)

## 2017-09-28 ENCOUNTER — Encounter: Payer: Self-pay | Admitting: Neurology

## 2017-09-28 ENCOUNTER — Ambulatory Visit: Payer: Commercial Managed Care - PPO | Admitting: Neurology

## 2017-09-28 VITALS — BP 146/90 | HR 80 | Ht 69.0 in | Wt 242.0 lb

## 2017-09-28 DIAGNOSIS — E669 Obesity, unspecified: Secondary | ICD-10-CM | POA: Diagnosis not present

## 2017-09-28 DIAGNOSIS — G4719 Other hypersomnia: Secondary | ICD-10-CM | POA: Diagnosis not present

## 2017-09-28 DIAGNOSIS — R0681 Apnea, not elsewhere classified: Secondary | ICD-10-CM

## 2017-09-28 DIAGNOSIS — R0683 Snoring: Secondary | ICD-10-CM

## 2017-09-28 DIAGNOSIS — G479 Sleep disorder, unspecified: Secondary | ICD-10-CM

## 2017-09-28 NOTE — Progress Notes (Signed)
Subjective:    Patient ID: Isaiah Miller is a 62 y.o. male.  HPI     Star Age, MD, PhD [facility] [address] [place], [place] [address]  Dear Dr. Zigmund Daniel,  I saw your patient, Isaiah Miller, upon your kind request in my neurologic clinic today for initial consultation of his sleep disorder, in particular, concern for underlying obstructive sleep apnea. The patient is accompanied by his wife today. As you know, Isaiah Miller is a 62 year old right-handed gentleman with an underlying medical history of ulcerative colitis, vitamin D deficiency, hepatitis, reflux disease, status post knee surgery, status post cholecystectomy, and obesity, who reports sleep onset difficulties for a few months. His wife endorses that he snores quite loudly at times and that he also makes gasping sounds and positive his breathing. Typically, she sleeps in a separate bedroom, she has sleep apnea and uses a CPAP machine. He would be willing to get tested for sleep apnea and even consider a CPAP machine if it comes to that point. He is not aware of any family history of OSA. He denies telltale symptoms of restless leg syndrome but has chronic knee pain, right more than left, has been taking ibuprofen 800 mg each night for that. He had seen orthopedics in the past and received a cortisone injection years ago to the right knee. His bedtime is typically around 10, rise time around 5. He does not have night to night nocturia or morning headaches. He has a dog in his bedroom, typically on the bed. He has a TV on at night but tries to turn it off before falling asleep.  For chronic sleep difficulty he has recently been started on Restoril. I reviewed your office note from 09/09/2017, at which time the Restoril was increased from 15 mg to 30 mg strength. He feels a little better after taking Restoril. His Epworth sleepiness score is 8/24, fatigue score is 29 out of 63. He is married  and lives with his wife, they have 3 children. He is a nonsmoker and utilizes alcohol occasionally, typically on weekends, does not utilize caffeine on a day-to-day basis. He takes sulfasalazine for his ulcerative colitis. Works as a Building control surveyor.   His Past Medical History Is Significant For: Past Medical History:  Diagnosis Date  . GERD (gastroesophageal reflux disease)   . Hepatitis    ? EtOH  . Ulcerative colitis    dx 1987 left and then more universal  . Vitamin D deficiency     His Past Surgical History Is Significant For: Past Surgical History:  Procedure Laterality Date  . CHOLECYSTECTOMY    . COLONOSCOPY W/ BIOPSIES  05/2006, 03/2009   ulcerative colitis left and right, no dysplasia  . KNEE SURGERY Right     His Family History Is Significant For: Family History  Problem Relation Age of Onset  . Diabetes Mother   . Colon cancer Neg Hx   . Rectal cancer Neg Hx   . Stomach cancer Neg Hx   . Esophageal cancer Neg Hx   . Pancreatic cancer Neg Hx   . Colon polyps Neg Hx   . Heart disease Neg Hx   . Kidney disease Neg Hx   . Liver disease Neg Hx     His Social History Is Significant For: Social History   Socioeconomic History  . Marital status: Married    Spouse name: Not on file  . Number of children: 3  . Years of  education: Not on file  . Highest education level: Not on file  Occupational History  . Occupation: Company secretary: [employer]  . Financial resource strain: Not on file  . Food insecurity:    Worry: Not on file    Inability: Not on file  . Transportation needs:    Medical: Not on file    Non-medical: Not on file  Tobacco Use  . Smoking status: Never Smoker  . Smokeless tobacco: Never Used  Substance and Sexual Activity  . Alcohol use: Yes    Alcohol/week: 0.0 standard drinks    Comment: occasional   . Drug use: No  . Sexual activity: Yes    Comment: married  Lifestyle  . Physical activity:    Days per week:  Not on file    Minutes per session: Not on file  . Stress: Not on file  Relationships  . Social connections:    Talks on phone: Not on file    Gets together: Not on file    Attends religious service: Not on file    Active member of club or organization: Not on file    Attends meetings of clubs or organizations: Not on file    Relationship status: Not on file  Other Topics Concern  . Not on file  Social History Narrative   Occasional caffeine     His Allergies Are:  No Known Allergies:   His Current Medications Are:  Outpatient Encounter Medications as of 09/28/2017  Medication Sig  . Ibuprofen (ADVIL PO) Take 800 mg by mouth as needed.  . ranitidine (ZANTAC) 150 MG tablet Take 150 mg by mouth as needed.    . sulfaSALAzine (AZULFIDINE) 500 MG tablet TAKE 4 TABLETS (2,000 MG TOTAL) TWICE A DAY (SEE YOU AT YOUR 10/29/16 APPOINTMENT AT 2:45 P.M.)  . sulfaSALAzine (AZULFIDINE) 500 MG tablet TAKE 4 TABLETS TWICE A DAY  . temazepam (RESTORIL) 30 MG capsule Take 1 capsule (30 mg total) by mouth at bedtime as needed for sleep.  . Vitamin D, Ergocalciferol, (DRISDOL) 50000 units CAPS capsule Take 1 capsule (50,000 Units total) by mouth every 7 (seven) days.  . [DISCONTINUED] sulfaSALAzine (AZULFIDINE) 500 MG tablet Take 4 tablets (2,000 mg total) by mouth 2 (two) times daily. (Patient not taking: Reported on 09/09/2017)   No facility-administered encounter medications on file as of 09/28/2017.   :  Review of Systems:  Out of a complete 14 point review of systems, all are reviewed and negative with the exception of these symptoms as listed below:  Review of Systems  Neurological:       Pt presents today to discuss his sleep. Pt has never had a sleep study but does endorse snoring.  Epworth Sleepiness Scale 0= would never doze 1= slight chance of dozing 2= moderate chance of dozing 3= high chance of dozing  Sitting and reading: 1 Watching TV: 3 Sitting inactive in a public place (ex.  Theater or meeting): 1 As a passenger in a car for an hour without a break: 1 Lying down to rest in the afternoon: 2 Sitting and talking to someone: 0 Sitting quietly after lunch (no alcohol): 0 In a car, while stopped in traffic: 0 Total: 8     Objective:  Neurological Exam  Physical Exam Physical Examination:   Vitals:   09/28/17 1053  BP: (!) 146/90  Pulse: 80    General Examination: The patient is a very pleasant 62 y.o. male  in no acute distress. He appears well-developed and well-nourished and well groomed.   HEENT: Normocephalic, atraumatic, pupils are equal, round and reactive to light and accommodation. Extraocular tracking is good without limitation to gaze excursion or nystagmus noted. Normal smooth pursuit is noted. Hearing is grossly intact. Tympanic membranes are clear bilaterally. Face is symmetric with normal facial animation and normal facial sensation. Speech is clear with no dysarthria noted. There is no hypophonia. There is no lip, neck/head, jaw or voice tremor. Neck is supple with full range of passive and active motion. There are no carotid bruits on auscultation. Oropharynx exam reveals: mild mouth dryness, adequate dental hygiene and marked airway crowding, due to thicker soft palate, smaller airway entry, large uvula, tonsils in place of 1-2+ bilaterally, thicker tongue. Mallampati is class III. Neck circumference is 18 inches. He has a minimal overbite. Tongue protrudes centrally and palate elevates symmetrically.  Chest: Clear to auscultation without wheezing, rhonchi or crackles noted.  Heart: S1+S2+0, regular and normal without murmurs, rubs or gallops noted.   Abdomen: Soft, non-tender and non-distended with normal bowel sounds appreciated on auscultation.  Extremities: There is no pitting edema in the distal lower extremities bilaterally. Pedal pulses are intact.  Skin: Warm and dry without trophic changes noted.  Musculoskeletal: exam reveals no  obvious joint deformities, tenderness or joint swelling or erythema.   Neurologically:  Mental status: The patient is awake, alert and oriented in all 4 spheres. His immediate and remote memory, attention, language skills and fund of knowledge are appropriate. There is no evidence of aphasia, agnosia, apraxia or anomia. Speech is clear with normal prosody and enunciation. Thought process is linear. Mood is normal and affect is normal.  Cranial nerves II - XII are as described above under HEENT exam. In addition: shoulder shrug is normal with equal shoulder height noted. Motor exam: Normal bulk, strength and tone is noted. There is no drift, tremor or rebound. Romberg is negative. Reflexes are 1+ throughout. Fine motor skills and coordination: grossly intact with normal finger taps, normal hand movements, normal rapid alternating patting, normal foot taps and normal foot agility.  Cerebellar testing: No dysmetria or intention tremor on finger to nose testing. Heel to shin is unremarkable bilaterally. There is no truncal or gait ataxia.  Sensory exam: intact to light touch in the upper and lower extremities.  Gait, station and balance: He stands without difficulty. No veering to one side is noted. No leaning to one side is noted. Posture is age-appropriate and stance is narrow based. Gait shows normal stride length and normal pace. No problems turning are noted. Tandem walk is unremarkable.   Assessment and Plan:   In summary, Isaiah Miller is a very pleasant 62 y.o.-year old male with an underlying medical history of ulcerative colitis, vitamin D deficiency, hepatitis, reflux disease, status post knee surgery, status post cholecystectomy, and obesity, whose history and physical exam are concerning for obstructive sleep apnea (OSA).  I had a long chat with the patient and his wife about my findings and the diagnosis of OSA, its prognosis and treatment options. We talked about medical treatments,  surgical interventions and non-pharmacological approaches. I explained in particular the risks and ramifications of untreated moderate to severe OSA, especially with respect to developing cardiovascular disease down the Road, including congestive heart failure, difficult to treat hypertension, cardiac arrhythmias, or stroke. Even type 2 diabetes has, in part, been linked to untreated OSA. Symptoms of untreated OSA include daytime sleepiness, memory problems, mood  irritability and mood disorder such as depression and anxiety, lack of energy, as well as recurrent headaches, especially morning headaches. We talked about trying to maintain a healthy lifestyle in general, as well as the importance of weight control. I encouraged the patient to eat healthy, exercise daily and keep well hydrated, to keep a scheduled bedtime and wake time routine, to not skip any meals and eat healthy snacks in between meals. I advised the patient not to drive when feeling sleepy. I recommended the following at this time: sleep study with potential positive airway pressure titration. (We will score hypopneas at 4%).   I explained the sleep test procedure to the patient and also outlined possible surgical and non-surgical treatment options of OSA, including the use of a custom-made dental device (which would require a referral to a specialist dentist or oral surgeon), upper airway surgical options, such as pillar implants, radiofrequency surgery, tongue base surgery, and UPPP (which would involve a referral to an ENT surgeon). Rarely, jaw surgery such as mandibular advancement may be considered.  I also explained the CPAP treatment option to the patient, who indicated that he would be willing to try CPAP if the need arises. I explained the importance of being compliant with PAP treatment, not only for insurance purposes but primarily to improve His symptoms, and for the patient's long term health benefit, including to reduce His  cardiovascular risks. I answered all their questions today and the patient and his wife were in agreement. I plan to see him back after the sleep study is completed and encouraged him to call with any interim questions, concerns, problems or updates.   Thank you very much for allowing me to participate in the care of this nice patient. If I can be of any further assistance to you please do not hesitate to call me at [phone].  Sincerely,   Star Age, MD, PhD

## 2017-09-28 NOTE — Patient Instructions (Addendum)

## 2017-10-12 ENCOUNTER — Telehealth: Payer: Self-pay

## 2017-10-12 NOTE — Telephone Encounter (Signed)
Pt called today stating that he is not interested in scheduling sleep study at this time.  The medicine that he has been placed on has been helping. And he is also really busy with work right now.  Pt stated that he will call us back to schedule sleep study when he is ready.

## 2017-10-31 ENCOUNTER — Other Ambulatory Visit: Payer: Self-pay | Admitting: Internal Medicine

## 2017-10-31 ENCOUNTER — Other Ambulatory Visit: Payer: Self-pay

## 2018-01-09 ENCOUNTER — Ambulatory Visit: Payer: Commercial Managed Care - PPO | Admitting: Family Medicine

## 2018-01-13 ENCOUNTER — Ambulatory Visit: Payer: Commercial Managed Care - PPO | Admitting: Family Medicine

## 2018-02-20 ENCOUNTER — Encounter: Payer: Self-pay | Admitting: Family Medicine

## 2018-02-20 ENCOUNTER — Ambulatory Visit: Payer: Commercial Managed Care - PPO | Admitting: Family Medicine

## 2018-02-20 VITALS — BP 132/76 | HR 92 | Temp 97.9°F | Ht 69.0 in | Wt 239.0 lb

## 2018-02-20 DIAGNOSIS — J069 Acute upper respiratory infection, unspecified: Secondary | ICD-10-CM | POA: Diagnosis not present

## 2018-02-20 MED ORDER — HYDROCODONE-HOMATROPINE 5-1.5 MG/5ML PO SYRP: 5.0000 mL | ORAL_SOLUTION | Freq: Three times a day (TID) | ORAL | 0 refills | Status: DC | PRN

## 2018-02-20 MED ORDER — TEMAZEPAM 30 MG PO CAPS: 30.0000 mg | ORAL_CAPSULE | Freq: Every evening | ORAL | 5 refills | Status: DC | PRN

## 2018-02-20 NOTE — Progress Notes (Signed)
Isaiah Miller - 63 y.o. male MRN [id number]  Date of birth: 1955-08-05  Subjective Chief Complaint  Patient presents with  . Cough    ongoing for three days-produtive. Has been taking Alka-seltzer Denies fevers or body aches.    HPI SUBJECTIVE:  Isaiah Miller is a 63 y.o. male who complains of coryza, post nasal drip and dry cough for 3 days.  Cough is affecting sleep.  He denies a history of chest pain, chills, fatigue, fevers, myalgias, nausea, shortness of breath, vomiting and wheezing and denies a history of asthma. Patient does not smoke cigarettes.  ROS:  A comprehensive ROS was completed and negative except as noted per HPI   No Known Allergies  Past Medical History:  Diagnosis Date  . GERD (gastroesophageal reflux disease)   . Hepatitis    ? EtOH  . Ulcerative colitis    dx 1987 left and then more universal  . Vitamin D deficiency     Past Surgical History:  Procedure Laterality Date  . CHOLECYSTECTOMY    . COLONOSCOPY W/ BIOPSIES  05/2006, 03/2009   ulcerative colitis left and right, no dysplasia  . KNEE SURGERY Right     Social History   Socioeconomic History  . Marital status: Married    Spouse name: Not on file  . Number of children: 3  . Years of education: Not on file  . Highest education level: Not on file  Occupational History  . Occupation: Company secretary: [employer]  . Financial resource strain: Not on file  . Food insecurity:    Worry: Not on file    Inability: Not on file  . Transportation needs:    Medical: Not on file    Non-medical: Not on file  Tobacco Use  . Smoking status: Never Smoker  . Smokeless tobacco: Never Used  Substance and Sexual Activity  . Alcohol use: Yes    Alcohol/week: 0.0 standard drinks    Comment: occasional   . Drug use: No  . Sexual activity: Yes    Comment: married  Lifestyle  . Physical activity:    Days per week: Not on file    Minutes per session: Not on file   . Stress: Not on file  Relationships  . Social connections:    Talks on phone: Not on file    Gets together: Not on file    Attends religious service: Not on file    Active member of club or organization: Not on file    Attends meetings of clubs or organizations: Not on file    Relationship status: Not on file  Other Topics Concern  . Not on file  Social History Narrative   Occasional caffeine     Family History  Problem Relation Age of Onset  . Diabetes Mother   . Colon cancer Neg Hx   . Rectal cancer Neg Hx   . Stomach cancer Neg Hx   . Esophageal cancer Neg Hx   . Pancreatic cancer Neg Hx   . Colon polyps Neg Hx   . Heart disease Neg Hx   . Kidney disease Neg Hx   . Liver disease Neg Hx     Health Maintenance  Topic Date Due  . Hepatitis C Screening  April 13, 1955  . HIV Screening  08/08/1971  . TETANUS/TDAP  08/08/1975  . COLONOSCOPY  11/26/2016  . INFLUENZA VACCINE  Completed    ----------------------------------------------------------------------------------------------------------------------------------------------------------------------------------------------------------------- Physical Exam BP 132/76  Pulse 92   Temp 97.9 F (36.6 C) (Oral)   Ht 5' 9"  (1.753 m)   Wt 239 lb (108.4 kg)   SpO2 96%   BMI 35.29 kg/m   Physical Exam Constitutional:      Appearance: Normal appearance.  HENT:     Head: Normocephalic and atraumatic.     Right Ear: Tympanic membrane normal.     Left Ear: Tympanic membrane normal.     Nose: No congestion.     Mouth/Throat:     Mouth: Mucous membranes are moist.  Eyes:     General: No scleral icterus. Cardiovascular:     Rate and Rhythm: Normal rate and regular rhythm.  Pulmonary:     Effort: Pulmonary effort is normal.     Breath sounds: Normal breath sounds.  Lymphadenopathy:     Cervical: No cervical adenopathy.  Skin:    General: Skin is warm and dry.     Findings: No rash.  Neurological:     General: No  focal deficit present.     Mental Status: He is alert.  Psychiatric:        Mood and Affect: Mood normal.        Behavior: Behavior normal.     ------------------------------------------------------------------------------------------------------------------------------------------------------------------------------------------------------------------- Assessment and Plan  Viral upper respiratory tract infection Hycodan syrup for cough Symptomatic therapy suggested: push fluids, rest, use vaporizer or mist prn and return office visit prn if symptoms persist or worsen. Lack of antibiotic effectiveness discussed with him. Call or return to clinic prn if these symptoms worsen or fail to improve as anticipated.

## 2018-02-20 NOTE — Patient Instructions (Addendum)

## 2018-02-20 NOTE — Assessment & Plan Note (Signed)
Hycodan syrup for cough Symptomatic therapy suggested: push fluids, rest, use vaporizer or mist prn and return office visit prn if symptoms persist or worsen. Lack of antibiotic effectiveness discussed with him. Call or return to clinic prn if these symptoms worsen or fail to improve as anticipated.

## 2018-03-06 ENCOUNTER — Encounter: Payer: Self-pay | Admitting: Internal Medicine

## 2018-03-06 DIAGNOSIS — M25561 Pain in right knee: Secondary | ICD-10-CM | POA: Diagnosis not present

## 2018-03-08 ENCOUNTER — Other Ambulatory Visit: Payer: Self-pay | Admitting: Family Medicine

## 2018-03-08 NOTE — Progress Notes (Signed)
Rx renewal request for temazepam.  PDMP reviewed and appropriate.

## 2018-05-05 ENCOUNTER — Ambulatory Visit: Payer: Commercial Managed Care - PPO | Admitting: Internal Medicine

## 2018-07-21 ENCOUNTER — Other Ambulatory Visit: Payer: Self-pay | Admitting: Family Medicine

## 2018-08-14 ENCOUNTER — Telehealth: Payer: Self-pay | Admitting: Family Medicine

## 2018-08-14 NOTE — Telephone Encounter (Signed)
Questions for Screening COVID-19  Symptom onset: n/a  Travel or Contacts: no During this illness, did/does the patient experience any of the following symptoms? Fever >100.78F []   Yes [x]   No []   Unknown Subjective fever (felt feverish) []   Yes [x]   No []   Unknown Chills []   Yes [x]   No []   Unknown Muscle aches (myalgia) []   Yes [x]   No []   Unknown Runny nose (rhinorrhea) []   Yes [x]   No []   Unknown Sore throat []   Yes [x]   No []   Unknown Cough (new onset or worsening of chronic cough) []   Yes [x]   No []   Unknown Shortness of breath (dyspnea) []   Yes [x]   No []   Unknown Nausea or vomiting []   Yes [x]   No []   Unknown Headache []   Yes [x]   No []   Unknown Abdominal pain  []   Yes [x]   No []   Unknown Diarrhea (?3 loose/looser than normal stools/24hr period) []   Yes [x]   No []   Unknown Other, specify:

## 2018-08-15 ENCOUNTER — Ambulatory Visit (INDEPENDENT_AMBULATORY_CARE_PROVIDER_SITE_OTHER): Payer: Commercial Managed Care - PPO | Admitting: Family Medicine

## 2018-08-15 ENCOUNTER — Other Ambulatory Visit: Payer: Self-pay

## 2018-08-15 ENCOUNTER — Encounter: Payer: Self-pay | Admitting: Family Medicine

## 2018-08-15 VITALS — BP 138/80 | HR 94 | Temp 98.4°F | Resp 16 | Ht 69.25 in | Wt 239.0 lb

## 2018-08-15 DIAGNOSIS — H6120 Impacted cerumen, unspecified ear: Secondary | ICD-10-CM | POA: Insufficient documentation

## 2018-08-15 DIAGNOSIS — Z125 Encounter for screening for malignant neoplasm of prostate: Secondary | ICD-10-CM | POA: Diagnosis not present

## 2018-08-15 DIAGNOSIS — E559 Vitamin D deficiency, unspecified: Secondary | ICD-10-CM

## 2018-08-15 DIAGNOSIS — K5100 Ulcerative (chronic) pancolitis without complications: Secondary | ICD-10-CM

## 2018-08-15 DIAGNOSIS — Z13220 Encounter for screening for lipoid disorders: Secondary | ICD-10-CM

## 2018-08-15 DIAGNOSIS — H6122 Impacted cerumen, left ear: Secondary | ICD-10-CM | POA: Insufficient documentation

## 2018-08-15 DIAGNOSIS — Z0000 Encounter for general adult medical examination without abnormal findings: Secondary | ICD-10-CM

## 2018-08-15 LAB — COMPREHENSIVE METABOLIC PANEL
ALT: 20 U/L (ref 0–53)
AST: 18 U/L (ref 0–37)
Albumin: 4.4 g/dL (ref 3.5–5.2)
Alkaline Phosphatase: 69 U/L (ref 39–117)
BUN: 16 mg/dL (ref 6–23)
CO2: 26 mEq/L (ref 19–32)
Calcium: 9.3 mg/dL (ref 8.4–10.5)
Chloride: 105 mEq/L (ref 96–112)
Creatinine, Ser: 0.76 mg/dL (ref 0.40–1.50)
GFR: 103.92 mL/min (ref >60.00)
Glucose, Bld: 89 mg/dL (ref 70–99)
Potassium: 4.6 mEq/L (ref 3.5–5.1)
Sodium: 138 mEq/L (ref 135–145)
Total Bilirubin: 0.4 mg/dL (ref 0.2–1.2)
Total Protein: 6.8 g/dL (ref 6.0–8.3)

## 2018-08-15 LAB — CBC WITH DIFFERENTIAL/PLATELET
Basophils Absolute: 0.1 10*3/uL (ref 0.0–0.1)
Basophils Relative: 0.8 % (ref 0.0–3.0)
Eosinophils Absolute: 0.1 10*3/uL (ref 0.0–0.7)
Eosinophils Relative: 2 % (ref 0.0–5.0)
HCT: 42.8 % (ref 39.0–52.0)
Hemoglobin: 14.1 g/dL (ref 13.0–17.0)
Lymphocytes Relative: 26.8 % (ref 12.0–46.0)
Lymphs Abs: 1.7 10*3/uL (ref 0.7–4.0)
MCHC: 33 g/dL (ref 30.0–36.0)
MCV: 100.5 fl — ABNORMAL HIGH (ref 78.0–100.0)
Monocytes Absolute: 0.7 10*3/uL (ref 0.1–1.0)
Monocytes Relative: 11 % (ref 3.0–12.0)
Neutro Abs: 3.7 10*3/uL (ref 1.4–7.7)
Neutrophils Relative %: 59.4 % (ref 43.0–77.0)
Platelets: 263 10*3/uL (ref 150.0–400.0)
RBC: 4.26 Mil/uL (ref 4.22–5.81)
RDW: 14 % (ref 11.5–15.5)
WBC: 6.2 10*3/uL (ref 4.0–10.5)

## 2018-08-15 LAB — LIPID PANEL
Cholesterol: 222 mg/dL — ABNORMAL HIGH (ref 0–200)
HDL: 42.4 mg/dL (ref >39.00)
LDL Cholesterol: 158 mg/dL — ABNORMAL HIGH (ref 0–99)
NonHDL: 179.23
Total CHOL/HDL Ratio: 5
Triglycerides: 106 mg/dL (ref 0.0–149.0)
VLDL: 21.2 mg/dL (ref 0.0–40.0)

## 2018-08-15 LAB — TSH: TSH: 2.03 u[IU]/mL (ref 0.35–4.50)

## 2018-08-15 LAB — VITAMIN D 25 HYDROXY (VIT D DEFICIENCY, FRACTURES): VITD: 16.98 ng/mL — ABNORMAL LOW (ref 30.00–100.00)

## 2018-08-15 LAB — PSA: PSA: 1.96 ng/mL (ref 0.10–4.00)

## 2018-08-15 NOTE — Patient Instructions (Signed)
Preventive Care 40-62 Years Old, Male Preventive care refers to lifestyle choices and visits with your health care provider that can promote health and wellness. This includes:  A yearly physical exam. This is also called an annual well check.  Regular dental and eye exams.  Immunizations.  Screening for certain conditions.  Healthy lifestyle choices, such as eating a healthy diet, getting regular exercise, not using drugs or products that contain nicotine and tobacco, and limiting alcohol use. What can I expect for my preventive care visit? Physical exam Your health care provider will check:  Height and weight. These may be used to calculate body mass index (BMI), which is a measurement that tells if you are at a healthy weight.  Heart rate and blood pressure.  Your skin for abnormal spots. Counseling Your health care provider may ask you questions about:  Alcohol, tobacco, and drug use.  Emotional well-being.  Home and relationship well-being.  Sexual activity.  Eating habits.  Work and work environment. What immunizations do I need?  Influenza (flu) vaccine  This is recommended every year. Tetanus, diphtheria, and pertussis (Tdap) vaccine  You may need a Td booster every 10 years. Varicella (chickenpox) vaccine  You may need this vaccine if you have not already been vaccinated. Zoster (shingles) vaccine  You may need this after age 60. Measles, mumps, and rubella (MMR) vaccine  You may need at least one dose of MMR if you were born in 1957 or later. You may also need a second dose. Pneumococcal conjugate (PCV13) vaccine  You may need this if you have certain conditions and were not previously vaccinated. Pneumococcal polysaccharide (PPSV23) vaccine  You may need one or two doses if you smoke cigarettes or if you have certain conditions. Meningococcal conjugate (MenACWY) vaccine  You may need this if you have certain conditions. Hepatitis A vaccine   You may need this if you have certain conditions or if you travel or work in places where you may be exposed to hepatitis A. Hepatitis B vaccine  You may need this if you have certain conditions or if you travel or work in places where you may be exposed to hepatitis B. Haemophilus influenzae type b (Hib) vaccine  You may need this if you have certain risk factors. Human papillomavirus (HPV) vaccine  If recommended by your health care provider, you may need three doses over 6 months. You may receive vaccines as individual doses or as more than one vaccine together in one shot (combination vaccines). Talk with your health care provider about the risks and benefits of combination vaccines. What tests do I need? Blood tests  Lipid and cholesterol levels. These may be checked every 5 years, or more frequently if you are over 50 years old.  Hepatitis C test.  Hepatitis B test. Screening  Lung cancer screening. You may have this screening every year starting at age 55 if you have a 30-pack-year history of smoking and currently smoke or have quit within the past 15 years.  Prostate cancer screening. Recommendations will vary depending on your family history and other risks.  Colorectal cancer screening. All adults should have this screening starting at age 50 and continuing until age 75. Your health care provider may recommend screening at age 45 if you are at increased risk. You will have tests every 1-10 years, depending on your results and the type of screening test.  Diabetes screening. This is done by checking your blood sugar (glucose) after you have not eaten   for a while (fasting). You may have this done every 1-3 years.  Sexually transmitted disease (STD) testing. Follow these instructions at home: Eating and drinking  Eat a diet that includes fresh fruits and vegetables, whole grains, lean protein, and low-fat dairy products.  Take vitamin and mineral supplements as recommended  by your health care provider.  Do not drink alcohol if your health care provider tells you not to drink.  If you drink alcohol: ? Limit how much you have to 0-2 drinks a day. ? Be aware of how much alcohol is in your drink. In the [place] one drink equals one 12 oz bottle of beer (355 mL), one 5 oz glass of wine (148 mL), or one 1 oz glass of hard liquor (44 mL). Lifestyle  Take daily care of your teeth and gums.  Stay active. Exercise for at least 30 minutes on 5 or more days each week.  Do not use any products that contain nicotine or tobacco, such as cigarettes, e-cigarettes, and chewing tobacco. If you need help quitting, ask your health care provider.  If you are sexually active, practice safe sex. Use a condom or other form of protection to prevent STIs (sexually transmitted infections).  Talk with your health care provider about taking a low-dose aspirin every day starting at age 33. What's next?  Go to your health care provider once a year for a well check visit.  Ask your health care provider how often you should have your eyes and teeth checked.  Stay up to date on all vaccines. This information is not intended to replace advice given to you by your health care provider. Make sure you discuss any questions you have with your health care provider. Document Released: 02/21/2015 Document Revised: 01/19/2018 Document Reviewed: 01/19/2018 Elsevier Patient Education  2020 [employer].

## 2018-08-15 NOTE — Assessment & Plan Note (Signed)
Well adult Orders Placed This Encounter  Procedures  . Comp Met (CMET)  . CBC w/Diff  . Lipid panel  . TSH  . PSA  . Vitamin D (25 hydroxy)    Screenings: Lipid and PSA Immunizations: UTD Anticipatory guidance/Risk factor reduction: Recommendations per AVS

## 2018-08-15 NOTE — Assessment & Plan Note (Signed)
-  Check Vitamin D levels.

## 2018-08-15 NOTE — Assessment & Plan Note (Signed)
-  Due for updated colonoscopy.  Remains well controlled with sulfasalazine.

## 2018-08-15 NOTE — Assessment & Plan Note (Signed)
-  Ear lavage completed today without difficulty.

## 2018-08-15 NOTE — Progress Notes (Signed)
Isaiah Miller - 63 y.o. male MRN [id number]  Date of birth: 1956/01/17  Subjective Chief Complaint  Patient presents with  . Annual Exam    CPE / waxor fluid build up in bilat ears    HPI  Isaiah Miller is a 63 y.o. male with history of UC and insomnia here today for annual exam.  He reports that he is doing well.  He is due for updated colonoscopy given history of UC, He did have this scheduled however it was canceled due to [place].   Insomnia remains well controlled with restoril.  He has noticed feeling of clogged ears, has history of cerumen impaction.  He is a non-smoker and drinks EtOH in moderation.  He walks for exercise and follows a balanced diet.    Review of Systems  Constitutional: Negative for chills, fever, malaise/fatigue and weight loss.  HENT: Negative for congestion, ear pain and sore throat.   Eyes: Negative for blurred vision, double vision and pain.  Respiratory: Negative for cough and shortness of breath.   Cardiovascular: Negative for chest pain and palpitations.  Gastrointestinal: Negative for abdominal pain, blood in stool, constipation, heartburn and nausea.  Genitourinary: Negative for dysuria and urgency.  Musculoskeletal: Negative for joint pain and myalgias.  Neurological: Negative for dizziness and headaches.  Endo/Heme/Allergies: Does not bruise/bleed easily.  Psychiatric/Behavioral: Negative for depression. The patient is not nervous/anxious and does not have insomnia.     No Known Allergies  Past Medical History:  Diagnosis Date  . GERD (gastroesophageal reflux disease)   . Hepatitis    ? EtOH  . Ulcerative colitis    dx 1987 left and then more universal  . Vitamin D deficiency     Past Surgical History:  Procedure Laterality Date  . CHOLECYSTECTOMY    . COLONOSCOPY W/ BIOPSIES  05/2006, 03/2009   ulcerative colitis left and right, no dysplasia  . KNEE SURGERY Right     Social History   Socioeconomic History  . Marital status:  Married    Spouse name: Not on file  . Number of children: 3  . Years of education: Not on file  . Highest education level: Not on file  Occupational History  . Occupation: Company secretary: [employer]  . Financial resource strain: Not very hard  . Food insecurity    Worry: Never true    Inability: Never true  . Transportation needs    Medical: No    Non-medical: No  Tobacco Use  . Smoking status: Never Smoker  . Smokeless tobacco: Never Used  Substance and Sexual Activity  . Alcohol use: Yes    Alcohol/week: 4.0 - 6.0 standard drinks    Types: 4 - 6 Standard drinks or equivalent per week    Comment: occasional   . Drug use: No  . Sexual activity: Yes    Comment: married  Lifestyle  . Physical activity    Days per week: 1 day    Minutes per session: Not on file  . Stress: Only a little  Relationships  . Social Herbalist on phone: Three times a week    Gets together: Three times a week    Attends religious service: Not on file    Active member of club or organization: Not on file    Attends meetings of clubs or organizations: Not on file    Relationship status: Married  Other Topics Concern  . Not  on file  Social History Narrative   Occasional caffeine     Family History  Problem Relation Age of Onset  . Diabetes Mother   . Colon cancer Neg Hx   . Rectal cancer Neg Hx   . Stomach cancer Neg Hx   . Esophageal cancer Neg Hx   . Pancreatic cancer Neg Hx   . Colon polyps Neg Hx   . Heart disease Neg Hx   . Kidney disease Neg Hx   . Liver disease Neg Hx     Health Maintenance  Topic Date Due  . Hepatitis C Screening  09[birth year]  . HIV Screening  08/08/1971  . COLONOSCOPY  11/26/2016  . INFLUENZA VACCINE  09/09/2018  . TETANUS/TDAP  04/13/2028     ----------------------------------------------------------------------------------------------------------------------------------------------------------------------------------------------------------------- Physical Exam BP 138/80   Pulse 94   Temp 98.4 F (36.9 C) (Oral)   Resp 16   Ht 5' 9.25" (1.759 m)   Wt 239 lb (108.4 kg)   SpO2 93%   BMI 35.04 kg/m   Physical Exam Constitutional:      General: He is not in acute distress. HENT:     Head: Normocephalic and atraumatic.     Right Ear: External ear normal.     Left Ear: External ear normal. There is impacted cerumen.  Eyes:     General: No scleral icterus. Neck:     Musculoskeletal: Normal range of motion.     Thyroid: No thyromegaly.  Cardiovascular:     Rate and Rhythm: Normal rate and regular rhythm.     Heart sounds: Normal heart sounds.  Pulmonary:     Effort: Pulmonary effort is normal.     Breath sounds: Normal breath sounds.  Abdominal:     General: Bowel sounds are normal. There is no distension.     Palpations: Abdomen is soft.     Tenderness: There is no abdominal tenderness. There is no guarding.  Lymphadenopathy:     Cervical: No cervical adenopathy.  Skin:    General: Skin is warm and dry.     Findings: No rash.  Neurological:     Mental Status: He is alert and oriented to person, place, and time.     Cranial Nerves: No cranial nerve deficit.     Motor: No abnormal muscle tone.  Psychiatric:        Behavior: Behavior normal.     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  Remains well controlled with sulfasalazine.   Vitamin D deficiency -Check Vitamin D levels.   Cerumen impaction -Ear lavage completed today without difficulty.    Well adult exam Well adult Orders Placed This Encounter  Procedures  . Comp Met (CMET)  . CBC w/Diff  . Lipid panel  . TSH  . PSA  . Vitamin D (25 hydroxy)    Screenings: Lipid and PSA Immunizations: UTD Anticipatory guidance/Risk factor reduction: Recommendations per AVS

## 2018-08-21 ENCOUNTER — Other Ambulatory Visit: Payer: Self-pay | Admitting: Family Medicine

## 2018-10-26 ENCOUNTER — Other Ambulatory Visit: Payer: Self-pay | Admitting: Internal Medicine

## 2018-10-26 ENCOUNTER — Telehealth: Payer: Self-pay | Admitting: Internal Medicine

## 2018-10-26 NOTE — Telephone Encounter (Signed)
Patient seen in 10/2016. Is he supposed to be on this? Please advise Sir, thank you.

## 2018-10-26 NOTE — Telephone Encounter (Signed)
Refilled sulfasalazine x 3 mos no refills  Please call him and he needs to:  1) take folic acid 1 mg daily - it is OTC and needs to take with sulfasalazine 2) Schedule a f/u office visit next available

## 2018-10-27 NOTE — Telephone Encounter (Signed)
Patient notified of the results and recommendations His wife is about to have surgery and he needs to call back and schedule after her surgery

## 2019-01-24 ENCOUNTER — Other Ambulatory Visit: Payer: Self-pay | Admitting: Internal Medicine

## 2019-02-15 ENCOUNTER — Other Ambulatory Visit: Payer: Self-pay | Admitting: Family Medicine

## 2019-03-01 ENCOUNTER — Telehealth: Payer: Self-pay | Admitting: Internal Medicine

## 2019-03-01 DIAGNOSIS — R197 Diarrhea, unspecified: Secondary | ICD-10-CM

## 2019-03-01 DIAGNOSIS — K5100 Ulcerative (chronic) pancolitis without complications: Secondary | ICD-10-CM

## 2019-03-01 MED ORDER — PREDNISONE 10 MG PO TABS: ORAL_TABLET | ORAL | 0 refills | Status: DC

## 2019-03-01 NOTE — Telephone Encounter (Signed)
I spoke with Isaiah Miller and he will try to come tomorrow to get the blood work and stool culture. He said the stools are not watery so no C diff PCR ordered.    He said he has missed some doses of the sulfasalazine so for the past 3 days he has been taking 5 tablets TID.

## 2019-03-01 NOTE — Telephone Encounter (Signed)
I spoke with Isaiah Miller and he is requesting prednisone. He is having the following symptoms for the past 4-5 days: urgency, diarrhea 3 to 4 times a day, cramping, a little rectal bleeding, sluggish, a little abdominal pain, no fever, some nausea. He has an appointment on 03/22/2019. Please advise Sir? Thank you.

## 2019-03-01 NOTE — Telephone Encounter (Signed)
OK  1) Prednisone 10 mg tabs # 100 take 4 each day x 5 days 3 each day x 5 days then 2 each day and stay there til he sees me  2) I do want him to also come for labs to include:  CBC CMET Stool culture   If stools are watery have him do C diff PCR also   3) Is he still taking his sulfasalazine?

## 2019-03-02 ENCOUNTER — Other Ambulatory Visit (INDEPENDENT_AMBULATORY_CARE_PROVIDER_SITE_OTHER): Payer: Commercial Managed Care - PPO

## 2019-03-02 DIAGNOSIS — K5100 Ulcerative (chronic) pancolitis without complications: Secondary | ICD-10-CM | POA: Diagnosis not present

## 2019-03-02 LAB — CBC WITH DIFFERENTIAL/PLATELET
Basophils Absolute: 0 10*3/uL (ref 0.0–0.1)
Basophils Relative: 0.3 % (ref 0.0–3.0)
Eosinophils Absolute: 0 10*3/uL (ref 0.0–0.7)
Eosinophils Relative: 0.1 % (ref 0.0–5.0)
HCT: 41.8 % (ref 39.0–52.0)
Hemoglobin: 13.8 g/dL (ref 13.0–17.0)
Lymphocytes Relative: 12.4 % (ref 12.0–46.0)
Lymphs Abs: 1.8 10*3/uL (ref 0.7–4.0)
MCHC: 33 g/dL (ref 30.0–36.0)
MCV: 100.4 fl — ABNORMAL HIGH (ref 78.0–100.0)
Monocytes Absolute: 0.3 10*3/uL (ref 0.1–1.0)
Monocytes Relative: 2.3 % — ABNORMAL LOW (ref 3.0–12.0)
Neutro Abs: 12.3 10*3/uL — ABNORMAL HIGH (ref 1.4–7.7)
Neutrophils Relative %: 84.9 % — ABNORMAL HIGH (ref 43.0–77.0)
Platelets: 311 10*3/uL (ref 150.0–400.0)
RBC: 4.16 Mil/uL — ABNORMAL LOW (ref 4.22–5.81)
RDW: 14.8 % (ref 11.5–15.5)
WBC: 14.6 10*3/uL — ABNORMAL HIGH (ref 4.0–10.5)

## 2019-03-02 LAB — COMPREHENSIVE METABOLIC PANEL
ALT: 22 U/L (ref 0–53)
AST: 20 U/L (ref 0–37)
Albumin: 4.6 g/dL (ref 3.5–5.2)
Alkaline Phosphatase: 78 U/L (ref 39–117)
BUN: 26 mg/dL — ABNORMAL HIGH (ref 6–23)
CO2: 21 mEq/L (ref 19–32)
Calcium: 9.7 mg/dL (ref 8.4–10.5)
Chloride: 103 mEq/L (ref 96–112)
Creatinine, Ser: 1.04 mg/dL (ref 0.40–1.50)
GFR: 72.23 mL/min (ref >60.00)
Glucose, Bld: 144 mg/dL — ABNORMAL HIGH (ref 70–99)
Potassium: 4.2 mEq/L (ref 3.5–5.1)
Sodium: 135 mEq/L (ref 135–145)
Total Bilirubin: 0.5 mg/dL (ref 0.2–1.2)
Total Protein: 7.7 g/dL (ref 6.0–8.3)

## 2019-03-22 ENCOUNTER — Ambulatory Visit: Payer: Commercial Managed Care - PPO | Admitting: Internal Medicine

## 2019-03-22 ENCOUNTER — Other Ambulatory Visit: Payer: Self-pay

## 2019-03-22 ENCOUNTER — Encounter: Payer: Self-pay | Admitting: Internal Medicine

## 2019-03-22 VITALS — BP 132/76 | HR 88 | Temp 97.8°F | Ht 69.25 in | Wt 243.1 lb

## 2019-03-22 DIAGNOSIS — Z01818 Encounter for other preprocedural examination: Secondary | ICD-10-CM

## 2019-03-22 DIAGNOSIS — M1712 Unilateral primary osteoarthritis, left knee: Secondary | ICD-10-CM | POA: Diagnosis not present

## 2019-03-22 DIAGNOSIS — K5100 Ulcerative (chronic) pancolitis without complications: Secondary | ICD-10-CM | POA: Diagnosis not present

## 2019-03-22 MED ORDER — CELECOXIB 200 MG PO CAPS: 200.0000 mg | ORAL_CAPSULE | Freq: Two times a day (BID) | ORAL | 2 refills | Status: DC | PRN

## 2019-03-22 NOTE — Progress Notes (Signed)
Isaiah Miller 64 y.o. 07/07/1955 [id number]  Assessment & Plan:   Encounter Diagnoses  Name Primary?  . 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 Osteoarthritis of left knee, unspecified osteoarthritis type   . 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  Will prescribe Celebrex to use instead of traditional NSAIDs.  Explained how they can trigger a flare.  It has been since 2015 for colonoscopy.  He needs 1 to reassess.  We will schedule that.  Continue sulfasalazine.  I appreciate the opportunity to care for this patient. CC: Luetta Nutting, DO     Subjective:   Chief Complaint: Ulcerative colitis flare  HPI Isaiah Miller is here today following up after he had a flare of his ulcerative colitis.  It has been many years since he had any problems, he has been maintained on sulfasalazine.  He had contacted [place] the other month and I prescribed a prednisone taper and did a CMET and a CBC which were unremarkable except for mild elevation in glucose not fasting.  He is much better at this time down to 10 mg a day prednisone.  Last colonoscopy 2015 in remission.  He was having some left knee issues and was "hitting the ibuprofen pretty hard".  He is also been using some meloxicam which he does not find as helpful as ibuprofen.  Less so now.  Wt Readings from Last 3 Encounters:  03/22/19 243 lb 2 oz (110.3 kg)  08/15/18 239 lb (108.4 kg)  02/20/18 239 lb (108.4 kg)    No Known Allergies Current Meds  Medication Sig  . HYDROcodone-homatropine (HYCODAN) 5-1.5 MG/5ML syrup Take 5 mLs by mouth every 8 (eight) hours as needed for cough.  Marland Kitchen ibuprofen (ADVIL) 800 MG tablet Take 800 mg by mouth 3 (three) times daily as needed.  . meloxicam (MOBIC) 15 MG tablet Take 15 mg by mouth daily as needed.  . predniSONE (DELTASONE) 10 MG tablet Take 4 tablets daily x 5 days, then 3 tablets daily  for 5 days, then 2 tablets daily until office visit.  Marland Kitchen sulfaSALAzine (AZULFIDINE) 500 MG tablet TAKE 4 TABLETS TWICE A DAY  . temazepam (RESTORIL) 30 MG capsule TAKE 1 CAPSULE(30 MG) BY MOUTH AT BEDTIME AS NEEDED FOR SLEEP   Past Medical History:  Diagnosis Date  . GERD (gastroesophageal reflux disease)   . Hepatitis    ? EtOH  . Ulcerative colitis    dx 1987 left and then more universal  . Vitamin D deficiency    Past Surgical History:  Procedure Laterality Date  . CHOLECYSTECTOMY    . COLONOSCOPY W/ BIOPSIES  05/2006, 03/2009   ulcerative colitis left and right, no dysplasia  . KNEE SURGERY Right    Social History   Social History Narrative   Occasional caffeine    family history includes Diabetes in his mother.   Review of Systems As per HPI  Objective:   Physical Exam @BP  132/76 (BP Location: Left Arm, Patient Position: Sitting, Cuff Size: Large)   Pulse 88   Temp 97.8 F (36.6 C)   Ht 5' 9.25" (1.759 m)   Wt 243 lb 2 oz (110.3 kg)   SpO2 98%   BMI 35.64 kg/m @  General:  NAD Eyes:   anicteric Lungs:  clear Heart::  S1S2 no rubs, murmurs or gallops Abdomen:  soft and nontender, BS+ Ext:   no edema,  cyanosis or clubbing    Data Reviewed:  As per HPI

## 2019-03-22 NOTE — Assessment & Plan Note (Signed)
He has had a flare, could be due to NSAIDs.  Will prescribe Celebrex to use instead of traditional NSAIDs.  Explained how they can trigger a flare.  It has been since 2015 for colonoscopy.  He needs 1 to reassess.  We will schedule that.  Continue sulfasalazine.

## 2019-03-22 NOTE — Patient Instructions (Signed)
You have been scheduled for a colonoscopy. Please follow written instructions given to you at your visit today.  Please pick up your prep supplies at the pharmacy within the next 1-3 days. If you use inhalers (even only as needed), please bring them with you on the day of your procedure.   Take 1/2 tablet of your prednisone for a week and then stop per Dr Carlean Purl.    We have sent the following medications to your pharmacy for you to pick up at your convenience: celebrex  Take this instead of meloxicam or ibuprofen. It is less likely to cause you a flare.   I appreciate the opportunity to care for you. Silvano Rusk, MD, [facility]

## 2019-04-06 ENCOUNTER — Ambulatory Visit (INDEPENDENT_AMBULATORY_CARE_PROVIDER_SITE_OTHER): Payer: Commercial Managed Care - PPO

## 2019-04-06 ENCOUNTER — Other Ambulatory Visit: Payer: Self-pay

## 2019-04-06 DIAGNOSIS — Z1159 Encounter for screening for other viral diseases: Secondary | ICD-10-CM

## 2019-04-16 ENCOUNTER — Other Ambulatory Visit: Payer: Self-pay | Admitting: Internal Medicine

## 2019-05-02 ENCOUNTER — Other Ambulatory Visit: Payer: Self-pay | Admitting: Internal Medicine

## 2019-05-02 ENCOUNTER — Other Ambulatory Visit: Payer: Self-pay

## 2019-05-02 ENCOUNTER — Ambulatory Visit (INDEPENDENT_AMBULATORY_CARE_PROVIDER_SITE_OTHER): Payer: Commercial Managed Care - PPO

## 2019-05-02 DIAGNOSIS — Z1159 Encounter for screening for other viral diseases: Secondary | ICD-10-CM

## 2019-05-02 LAB — SARS CORONAVIRUS 2 (TAT 6-24 HRS): SARS Coronavirus 2: NEGATIVE

## 2019-05-03 ENCOUNTER — Encounter: Payer: Self-pay | Admitting: Internal Medicine

## 2019-05-04 ENCOUNTER — Ambulatory Visit (AMBULATORY_SURGERY_CENTER): Payer: Commercial Managed Care - PPO | Admitting: Internal Medicine

## 2019-05-04 ENCOUNTER — Other Ambulatory Visit: Payer: Self-pay

## 2019-05-04 ENCOUNTER — Encounter: Payer: Self-pay | Admitting: Internal Medicine

## 2019-05-04 VITALS — BP 107/75 | HR 85 | Temp 98.6°F | Resp 15 | Ht 69.25 in | Wt 243.0 lb

## 2019-05-04 DIAGNOSIS — K621 Rectal polyp: Secondary | ICD-10-CM

## 2019-05-04 DIAGNOSIS — D128 Benign neoplasm of rectum: Secondary | ICD-10-CM

## 2019-05-04 DIAGNOSIS — K529 Noninfective gastroenteritis and colitis, unspecified: Secondary | ICD-10-CM | POA: Diagnosis not present

## 2019-05-04 DIAGNOSIS — K5100 Ulcerative (chronic) pancolitis without complications: Secondary | ICD-10-CM

## 2019-05-04 MED ORDER — SODIUM CHLORIDE 0.9 % IV SOLN: 500.0000 mL | Freq: Once | INTRAVENOUS | Status: DC

## 2019-05-04 NOTE — Progress Notes (Signed)
Called to room to assist during endoscopic procedure.  Patient ID and intended procedure confirmed with present staff. Received instructions for my participation in the procedure from the performing physician.  

## 2019-05-04 NOTE — Progress Notes (Signed)
Pt's states no medical or surgical changes since previsit or office visit.  CS - temp DT - vitals

## 2019-05-04 NOTE — Patient Instructions (Addendum)
There was a very small polyp removed.  Some slight inflammation in the end of the colon.  Biopsies as usual.  Will let you know results and plans.  I appreciate the opportunity to care for you. Gatha Mayer, MD, [facility]  Read all of the handouts given to you by your recovery room nurse.  Thank-you for choosing [place] for your healthcare needs today  YOU HAD AN ENDOSCOPIC PROCEDURE TODAY AT [facility]:   Refer to the procedure report that was given to you for any specific questions about what was found during the examination.  If the procedure report does not answer your questions, please call your gastroenterologist to clarify.  If you requested that your care partner not be given the details of your procedure findings, then the procedure report has been included in a sealed envelope for you to review at your convenience later.  YOU SHOULD EXPECT: Some feelings of bloating in the abdomen. Passage of more gas than usual.  Walking can help get rid of the air that was put into your GI tract during the procedure and reduce the bloating. If you had a lower endoscopy (such as a colonoscopy or flexible sigmoidoscopy) you may notice spotting of blood in your stool or on the toilet paper. If you underwent a bowel prep for your procedure, you may not have a normal bowel movement for a few days.  Please Note:  You might notice some irritation and congestion in your nose or some drainage.  This is from the oxygen used during your procedure.  There is no need for concern and it should clear up in a day or so.  SYMPTOMS TO REPORT IMMEDIATELY:   Following lower endoscopy (colonoscopy or flexible sigmoidoscopy):  Excessive amounts of blood in the stool  Significant tenderness or worsening of abdominal pains  Swelling of the abdomen that is new, acute  Fever of 100F or higher   For urgent or emergent issues, a gastroenterologist can be reached at any hour by calling [phone]. Do not  use MyChart messaging for urgent concerns.    DIET:  We do recommend a small meal at first, but then you may proceed to your regular diet.  Drink plenty of fluids but you should avoid alcoholic beverages for 24 hours.  ACTIVITY:  You should plan to take it easy for the rest of today and you should NOT DRIVE or use heavy machinery until tomorrow (because of the sedation medicines used during the test).    FOLLOW UP: Our staff will call the number listed on your records 48-72 hours following your procedure to check on you and address any questions or concerns that you may have regarding the information given to you following your procedure. If we do not reach you, we will leave a message.  We will attempt to reach you two times.  During this call, we will ask if you have developed any symptoms of COVID 19. If you develop any symptoms (ie: fever, flu-like symptoms, shortness of breath, cough etc.) before then, please call [phone].  If you test positive for Covid 19 in the 2 weeks post procedure, please call and report this information to [place].    If any biopsies were taken you will be contacted by phone or by letter within the next 1-3 weeks.  Please call us at [phone] if you have not heard about the biopsies in 3 weeks.    SIGNATURES/CONFIDENTIALITY: You and/or your care partner have  signed paperwork which will be entered into your electronic medical record.  These signatures attest to the fact that that the information above on your After Visit Summary has been reviewed and is understood.  Full responsibility of the confidentiality of this discharge information lies with you and/or your care-partner.

## 2019-05-04 NOTE — Op Note (Signed)
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 The patient's tolerance of                            previous anesthesia was also reviewed. The risks                            and benefits of the procedure and the sedation                            options and risks were discussed with the patient.                            All questions were answered, and informed consent                            was obtained. Prior Anticoagulants: The patient has                            taken no previous anticoagulant or antiplatelet                            agents. ASA Grade Assessment: II - A patient with                            mild systemic disease. After reviewing the risks                            and benefits, the patient was deemed in                            satisfactory condition to undergo the procedure.  After obtaining informed consent, the colonoscope                            was passed under direct vision. Throughout the                            procedure, the patient's blood pressure, pulse, and                            oxygen saturations were monitored continuously. The                            Colonoscope was introduced through the anus and   advanced to the the cecum, identified by                            appendiceal orifice and ileocecal valve. The                            colonoscopy was performed without difficulty. The                            patient tolerated the procedure well. The quality                            of the bowel preparation was good. The ileocecal                            valve, appendiceal orifice, and rectum were                            photographed. The bowel preparation used was                            Miralax via split dose instruction. Scope In: 11:05:05 AM Scope Out: 11:21:00 AM Scope Withdrawal Time: 0 hours 13 minutes 40 seconds  Total Procedure Duration: 0 hours 15 minutes 55 seconds  Findings:                 The perianal and digital rectal examinations were                            normal.                           A diminutive polyp was found in the rectum. The                            polyp was sessile. The polyp was removed with a                            cold snare. Resection and retrieval were complete.                            Verification of patient identification for  the                            specimen was done. Estimated blood loss was minimal.                           A patchy area of mildly erythematous, granular,                            inflamed and ulcerated mucosa was found in the                            rectum and in the sigmoid colon. Biopsies were                            taken with a cold forceps for histology.                            Verification of patient identification for the                            specimen was done. Estimated blood loss was minimal.                           The exam was otherwise without abnormality on                            direct and retroflexion views. Complications:            No immediate complications. Estimated Blood Loss:     Estimated blood loss was minimal. Impression:               - One  diminutive polyp in the rectum, removed with                            a cold snare. Resected and retrieved.                           - Erythematous, granular, inflamed and ulcerated                            mucosa in the rectum and in the sigmoid colon.                            Biopsied. Biopsies of ebtire colon 2 every 10 cm -                            cecum/ascending; transverse; descending/prox                            sigmoid; distal sigmoid/rectum                           - The examination was otherwise normal on direct  and retroflexion views. Recommendation:           - Patient has a contact number available for                            emergencies. The signs and symptoms of potential                            delayed complications were discussed with the                            patient. Return to normal activities tomorrow.                            Written discharge instructions were provided to the                            patient.                           - Resume previous diet.                           - Continue present medications.                           - Repeat colonoscopy is recommended for                            surveillance. The colonoscopy date will be                            determined after pathology results from today's                            exam become available for review. Gatha Mayer, MD 05/04/2019 11:30:11 AM This report has been signed electronically.

## 2019-05-04 NOTE — Progress Notes (Signed)
To PACU, VSS. Report to [email or web address] 

## 2019-05-08 ENCOUNTER — Telehealth: Payer: Self-pay | Admitting: *Deleted

## 2019-05-08 NOTE — Telephone Encounter (Signed)
  Follow up Call-  Call back number 05/04/2019  Post procedure Call Back phone  # [phone]  Permission to leave phone message Yes  Some recent data might be hidden     Patient questions:  Do you have a fever, pain , or abdominal swelling? No. Pain Score  0 *  Have you tolerated food without any problems? Yes.    Have you been able to return to your normal activities? Yes.    Do you have any questions about your discharge instructions: Diet   No. Medications  No. Follow up visit  No.  Do you have questions or concerns about your Care? No.  Actions: * If pain score is 4 or above: No action needed, pain <4.  1. Have you developed a fever since your procedure? no  2.   Have you had an respiratory symptoms (SOB or cough) since your procedure? no  3.   Have you tested positive for COVID 19 since your procedure no  4.   Have you had any family members/close contacts diagnosed with the COVID 19 since your procedure?  no   If yes to any of these questions please route to Joylene John, RN and Erenest Rasher, RN

## 2019-05-09 ENCOUNTER — Other Ambulatory Visit: Payer: Self-pay | Admitting: Internal Medicine

## 2019-05-10 ENCOUNTER — Other Ambulatory Visit: Payer: Self-pay

## 2019-05-10 MED ORDER — MESALAMINE 1.2 G PO TBEC: 2.4000 g | DELAYED_RELEASE_TABLET | Freq: Two times a day (BID) | ORAL | 3 refills | Status: DC

## 2019-06-13 ENCOUNTER — Other Ambulatory Visit: Payer: Self-pay | Admitting: Family Medicine

## 2019-07-04 ENCOUNTER — Other Ambulatory Visit: Payer: Self-pay

## 2019-07-04 MED ORDER — MESALAMINE 1.2 G PO TBEC: 2.4000 g | DELAYED_RELEASE_TABLET | Freq: Two times a day (BID) | ORAL | 2 refills | Status: DC

## 2019-07-04 MED ORDER — CELECOXIB 200 MG PO CAPS: 200.0000 mg | ORAL_CAPSULE | Freq: Two times a day (BID) | ORAL | 1 refills | Status: DC | PRN

## 2019-07-04 NOTE — Telephone Encounter (Signed)
Lialda and celebrex refilled , sent to mail order as pharmacy requested. Patient up to date on appointments.

## 2019-07-05 ENCOUNTER — Other Ambulatory Visit: Payer: Self-pay

## 2019-07-06 ENCOUNTER — Encounter: Payer: Self-pay | Admitting: Family Medicine

## 2019-07-06 ENCOUNTER — Ambulatory Visit: Payer: Commercial Managed Care - PPO | Admitting: Family Medicine

## 2019-07-06 VITALS — BP 122/80 | HR 80 | Temp 98.2°F | Ht 69.25 in | Wt 251.6 lb

## 2019-07-06 DIAGNOSIS — F5101 Primary insomnia: Secondary | ICD-10-CM | POA: Diagnosis not present

## 2019-07-06 DIAGNOSIS — R0683 Snoring: Secondary | ICD-10-CM

## 2019-07-06 MED ORDER — ESZOPICLONE 1 MG PO TABS: 1.0000 mg | ORAL_TABLET | Freq: Every evening | ORAL | 1 refills | Status: DC | PRN

## 2019-07-06 NOTE — Progress Notes (Signed)
Established Patient Office Visit  Subjective:  Patient ID: Isaiah Miller, male    DOB: 1955/05/09  Age: 64 y.o. MRN: [id number]  CC:  Chief Complaint  Patient presents with  . Establish Care    TOC and medication refill.  Pt would like to discuss medication.    HPI [name] presents for follow-up of insomnia.  He has been taking temazepam pretty much daily over the last year or so.  It does not seem to be working as well for him.  Has issues with falling asleep and staying asleep.  He does not feel rested in the morning time.  He snores.  He drinks alcohol on the weekends and sometimes has more than just a few.  He had been scheduled for a sleep study but canceled it after his sleep improved with the temazepam.  He does urinate when he gets up at night but does not feel as though the urge to urinate is keeping him up.  Denies sleep aberration.  Past Medical History:  Diagnosis Date  . GERD (gastroesophageal reflux disease)   . Hepatitis    ? EtOH  . Ulcerative colitis    dx 1987 left and then more universal  . Vitamin D deficiency     Past Surgical History:  Procedure Laterality Date  . CHOLECYSTECTOMY    . COLONOSCOPY W/ BIOPSIES  05/2006, 03/2009   ulcerative colitis left and right, no dysplasia  . KNEE SURGERY Right     Family History  Problem Relation Age of Onset  . Diabetes Mother   . Colon cancer Neg Hx   . Rectal cancer Neg Hx   . Stomach cancer Neg Hx   . Esophageal cancer Neg Hx   . Pancreatic cancer Neg Hx   . Colon polyps Neg Hx   . Heart disease Neg Hx   . Kidney disease Neg Hx   . Liver disease Neg Hx     Social History   Socioeconomic History  . Marital status: Married    Spouse name: Not on file  . Number of children: 3  . Years of education: Not on file  . Highest education level: Not on file  Occupational History  . Occupation: Company secretary: [employer]  Tobacco Use  . Smoking status: Never Smoker  .  Smokeless tobacco: Never Used  Substance and Sexual Activity  . Alcohol use: Yes    Alcohol/week: 4.0 - 6.0 standard drinks    Types: 4 - 6 Standard drinks or equivalent per week    Comment: occasional   . Drug use: No  . Sexual activity: Yes    Comment: married  Other Topics Concern  . Not on file  Social History Narrative   Occasional caffeine    Social Determinants of Health   Financial Resource Strain: Low Risk   . Difficulty of Paying Living Expenses: Not very hard  Food Insecurity: No Food Insecurity  . Worried About Charity fundraiser in the Last Year: Never true  . Ran Out of Food in the Last Year: Never true  Transportation Needs: No Transportation Needs  . Lack of Transportation (Medical): No  . Lack of Transportation (Non-Medical): No  Physical Activity: Unknown  . Days of Exercise per Week: 1 day  . Minutes of Exercise per Session: Not on file  Stress: No Stress Concern Present  . Feeling of Stress : Only a little  Social Connections: Unknown  . Frequency  of Communication with Friends and Family: Three times a week  . Frequency of Social Gatherings with Friends and Family: Three times a week  . Attends Religious Services: Not on file  . Active Member of Clubs or Organizations: Not on file  . Attends Archivist Meetings: Not on file  . Marital Status: Married  Human resources officer Violence: Not At Risk  . Fear of Current or Ex-Partner: No  . Emotionally Abused: No  . Physically Abused: No  . Sexually Abused: No    Outpatient Medications Prior to Visit  Medication Sig Dispense Refill  . celecoxib (CELEBREX) 200 MG capsule Take 1 capsule (200 mg total) by mouth 2 (two) times daily as needed for moderate pain. 180 capsule 1  . mesalamine (LIALDA) 1.2 g EC tablet Take 2 tablets (2.4 g total) by mouth 2 (two) times daily. 360 tablet 2  . temazepam (RESTORIL) 30 MG capsule Take 1qhs (Plz sched with new provider for future fills) 30 capsule 0  .  HYDROcodone-homatropine (HYCODAN) 5-1.5 MG/5ML syrup Take 5 mLs by mouth every 8 (eight) hours as needed for cough. 120 mL 0   No facility-administered medications prior to visit.    No Known Allergies  ROS Review of Systems  Constitutional: Negative.   Respiratory: Negative.   Cardiovascular: Negative.   Gastrointestinal: Negative.   Endocrine: Negative for polyphagia and polyuria.  Genitourinary: Negative.   Musculoskeletal: Negative for gait problem and joint swelling.  Skin: Negative for pallor and rash.  Allergic/Immunologic: Negative for immunocompromised state.  Neurological: Negative for light-headedness and numbness.  Hematological: Does not bruise/bleed easily.  Psychiatric/Behavioral: Negative.        Depression screen [facility] 2/9 07/06/2019 07/06/2019 07/29/2017  Decreased Interest 0 0 0  Down, Depressed, Hopeless 0 0 0  PHQ - 2 Score 0 0 0  Altered sleeping 2 - -  Tired, decreased energy 1 - -  Change in appetite 1 - -  Feeling bad or failure about yourself  0 - -  Trouble concentrating 1 - -  Moving slowly or fidgety/restless 0 - -  Suicidal thoughts 0 - -  PHQ-9 Score 5 - -  Difficult doing work/chores Somewhat difficult - -    Objective:    Physical Exam  Constitutional: He is oriented to person, place, and time. He appears well-developed and well-nourished. No distress.  HENT:  Head: Normocephalic and atraumatic.  Right Ear: External ear normal.  Left Ear: External ear normal.  Mouth/Throat:    Eyes: Conjunctivae are normal. Right eye exhibits no discharge. Left eye exhibits no discharge. No scleral icterus.  Neck: No JVD present. No tracheal deviation present. No thyromegaly present.  Cardiovascular: Normal rate, regular rhythm and normal heart sounds.  Pulmonary/Chest: Effort normal and breath sounds normal. No stridor.  Lymphadenopathy:    He has no cervical adenopathy.  Neurological: He is alert and oriented to person, place, and time.  Skin:  Skin is warm and dry. He is not diaphoretic.  Psychiatric: He has a normal mood and affect. His behavior is normal.    BP 122/80 (BP Location: Left Arm, Patient Position: Sitting, Cuff Size: Normal)   Pulse 80   Temp 98.2 F (36.8 C) (Temporal)   Ht 5' 9.25" (1.759 m)   Wt 251 lb 9.6 oz (114.1 kg)   SpO2 96%   BMI 36.89 kg/m  Wt Readings from Last 3 Encounters:  07/06/19 251 lb 9.6 oz (114.1 kg)  05/04/19 243 lb (110.2 kg)  03/22/19  243 lb 2 oz (110.3 kg)     Health Maintenance Due  Topic Date Due  . Hepatitis C Screening  Never done  . COVID-19 Vaccine (1) Never done  . HIV Screening  Never done    There are no preventive care reminders to display for this patient.  Lab Results  Component Value Date   TSH 2.03 08/15/2018   Lab Results  Component Value Date   WBC 14.6 (H) 03/02/2019   HGB 13.8 03/02/2019   HCT 41.8 03/02/2019   MCV 100.4 (H) 03/02/2019   PLT 311.0 03/02/2019   Lab Results  Component Value Date   NA 135 03/02/2019   K 4.2 03/02/2019   CO2 21 03/02/2019   GLUCOSE 144 (H) 03/02/2019   BUN 26 (H) 03/02/2019   CREATININE 1.04 03/02/2019   BILITOT 0.5 03/02/2019   ALKPHOS 78 03/02/2019   AST 20 03/02/2019   ALT 22 03/02/2019   PROT 7.7 03/02/2019   ALBUMIN 4.6 03/02/2019   CALCIUM 9.7 03/02/2019   ANIONGAP 14 11/30/2013   GFR 72.23 03/02/2019   Lab Results  Component Value Date   CHOL 222 (H) 08/15/2018   Lab Results  Component Value Date   HDL 42.40 08/15/2018   Lab Results  Component Value Date   LDLCALC 158 (H) 08/15/2018   Lab Results  Component Value Date   TRIG 106.0 08/15/2018   Lab Results  Component Value Date   CHOLHDL 5 08/15/2018   No results found for: HGBA1C    Assessment & Plan:   Problem List Items Addressed This Visit      Other   Primary insomnia - Primary   Relevant Medications   eszopiclone (LUNESTA) 1 MG TABS tablet   Snores   Relevant Orders   Ambulatory referral to Pulmonology      Meds  ordered this encounter  Medications  . eszopiclone (LUNESTA) 1 MG TABS tablet    Sig: Take 1 tablet (1 mg total) by mouth at bedtime as needed for sleep. Take immediately before bedtime    Dispense:  30 tablet    Refill:  1    Follow-up: Return in about 2 months (around 09/05/2019).   Discussed alcohol and its effect on sleep that it certainly is a sedative but it affects deep sleep he should avoid drinking especially with taking sleep medicine.  We will try Lunesta.  Warned him that these medications have been associated with long-term memory loss and falls.  He agrees to go for the sleep study.  He is due for a physical. Libby Maw, MD

## 2019-07-06 NOTE — Patient Instructions (Signed)
Insomnia Insomnia is a sleep disorder that makes it difficult to fall asleep or stay asleep. Insomnia can cause fatigue, low energy, difficulty concentrating, mood swings, and poor performance at work or school. There are three different ways to classify insomnia:  Difficulty falling asleep.  Difficulty staying asleep.  Waking up too early in the morning. Any type of insomnia can be long-term (chronic) or short-term (acute). Both are common. Short-term insomnia usually lasts for three months or less. Chronic insomnia occurs at least three times a week for longer than three months. What are the causes? Insomnia may be caused by another condition, situation, or substance, such as:  Anxiety.  Certain medicines.  Gastroesophageal reflux disease (GERD) or other gastrointestinal conditions.  Asthma or other breathing conditions.  Restless legs syndrome, sleep apnea, or other sleep disorders.  Chronic pain.  Menopause.  Stroke.  Abuse of alcohol, tobacco, or illegal drugs.  Mental health conditions, such as depression.  Caffeine.  Neurological disorders, such as Alzheimer's disease.  An overactive thyroid (hyperthyroidism). Sometimes, the cause of insomnia may not be known. What increases the risk? Risk factors for insomnia include:  Gender. Women are affected more often than men.  Age. Insomnia is more common as you get older.  Stress.  Lack of exercise.  Irregular work schedule or working night shifts.  Traveling between different time zones.  Certain medical and mental health conditions. What are the signs or symptoms? If you have insomnia, the main symptom is having trouble falling asleep or having trouble staying asleep. This may lead to other symptoms, such as:  Feeling fatigued or having low energy.  Feeling nervous about going to sleep.  Not feeling rested in the morning.  Having trouble concentrating.  Feeling irritable, anxious, or depressed. How  is this diagnosed? This condition may be diagnosed based on:  Your symptoms and medical history. Your health care provider may ask about: ? Your sleep habits. ? Any medical conditions you have. ? Your mental health.  A physical exam. How is this treated? Treatment for insomnia depends on the cause. Treatment may focus on treating an underlying condition that is causing insomnia. Treatment may also include:  Medicines to help you sleep.  Counseling or therapy.  Lifestyle adjustments to help you sleep better. Follow these instructions at home: Eating and drinking   Limit or avoid alcohol, caffeinated beverages, and cigarettes, especially close to bedtime. These can disrupt your sleep.  Do not eat a large meal or eat spicy foods right before bedtime. This can lead to digestive discomfort that can make it hard for you to sleep. Sleep habits   Keep a sleep diary to help you and your health care provider figure out what could be causing your insomnia. Write down: ? When you sleep. ? When you wake up during the night. ? How well you sleep. ? How rested you feel the next day. ? Any side effects of medicines you are taking. ? What you eat and drink.  Make your bedroom a dark, comfortable place where it is easy to fall asleep. ? Put up shades or blackout curtains to block light from outside. ? Use a white noise machine to block noise. ? Keep the temperature cool.  Limit screen use before bedtime. This includes: ? Watching TV. ? Using your smartphone, tablet, or computer.  Stick to a routine that includes going to bed and waking up at the same times every day and night. This can help you fall asleep faster. Consider  making a quiet activity, such as reading, part of your nighttime routine.  Try to avoid taking naps during the day so that you sleep better at night.  Get out of bed if you are still awake after 15 minutes of trying to sleep. Keep the lights down, but try reading or  doing a quiet activity. When you feel sleepy, go back to bed. General instructions  Take over-the-counter and prescription medicines only as told by your health care provider.  Exercise regularly, as told by your health care provider. Avoid exercise starting several hours before bedtime.  Use relaxation techniques to manage stress. Ask your health care provider to suggest some techniques that may work well for you. These may include: ? Breathing exercises. ? Routines to release muscle tension. ? Visualizing peaceful scenes.  Make sure that you drive carefully. Avoid driving if you feel very sleepy.  Keep all follow-up visits as told by your health care provider. This is important. Contact a health care provider if:  You are tired throughout the day.  You have trouble in your daily routine due to sleepiness.  You continue to have sleep problems, or your sleep problems get worse. Get help right away if:  You have serious thoughts about hurting yourself or someone else. If you ever feel like you may hurt yourself or others, or have thoughts about taking your own life, get help right away. You can go to your nearest emergency department or call:  Your local emergency services (911 in the [place]  A suicide crisis helpline, such as the [place] at (413)720-4757. This is open 24 hours a day. Summary  Insomnia is a sleep disorder that makes it difficult to fall asleep or stay asleep.  Insomnia can be long-term (chronic) or short-term (acute).  Treatment for insomnia depends on the cause. Treatment may focus on treating an underlying condition that is causing insomnia.  Keep a sleep diary to help you and your health care provider figure out what could be causing your insomnia. This information is not intended to replace advice given to you by your health care provider. Make sure you discuss any questions you have with your health care provider. Document  Revised: 01/07/2017 Document Reviewed: 11/04/2016 Else[employer] Patient Education  [place].  Sleep Studies A sleep study (polysomnogram) is a series of tests done while you are sleeping. A sleep study records your brain waves, heart rate, breathing rate, oxygen level, and eye and leg movements. A sleep study helps your health care provider:  See how well you sleep.  Diagnose a sleep disorder.  Determine how severe your sleep disorder is.  Create a plan to treat your sleep disorder. Your health care provider may recommend a sleep study if you:  Feel sleepy on most days.  Snore loudly while sleeping.  Have unusual behaviors while you sleep, such as walking.  Have brief periods in which you stop breathing during sleep (sleepapnea).  Fall asleep suddenly during the day (narcolepsy).  Have trouble falling asleep or staying asleep (insomnia).  Feel like you need to move your legs when trying to fall asleep (restless legs syndrome).  Move your legs by flexing and extending them regularly while asleep (periodic limb movement disorder).  Act out your dreams while you sleep (sleep behavior disorder).  Feel like you cannot move when you first wake up (sleep paralysis). What tests are part of a sleep study? Most sleep studies record the following during sleep:  Brain activity.  Eye movements.  Heart rate and rhythm.  Breathing rate and rhythm.  Blood-oxygen level.  Blood pressure.  Chest and belly movement as you breathe.  Arm and leg movements.  Snoring or other noises.  Body position. Where are sleep studies done? Sleep studies are done at sleep centers. A sleep center may be inside a hospital, office, or clinic. The room where you have the study may look like a hospital room or a hotel room. The health care providers doing the study may come in and out of the room during the study. Most of the time, they will be in another room monitoring your test as you  sleep. How are sleep studies done? Most sleep studies are done during a normal period of time for a full night of sleep. You will arrive at the study center in the evening and go home in the morning. Before the test  Bring your pajamas and toothbrush with you to the sleep study.  Do not have caffeine on the day of your sleep study.  Do not drink alcohol on the day of your sleep study.  Your health care provider will let you know if you should stop taking any of your regular medicines before the test. During the test      Round, sticky patches with sensors attached to recording wires (electrodes) are placed on your scalp, face, chest, and limbs.  Wires from all the electrodes and sensors run from your bed to a computer. The wires can be taken off and put back on if you need to get out of bed to go to the bathroom.  A sensor is placed over your nose to measure airflow.  A finger clip is put on your finger or ear to measure your blood oxygen level (pulse oximetry).  A belt is placed around your belly and a belt is placed around your chest to measure breathing movements.  If you have signs of the sleep disorder called sleep apnea during your test, you may get a treatment mask to wear for the second half of the night. ? The mask provides positive airway pressure (PAP) to help you breathe better during sleep. This may greatly improve your sleep apnea. ? You will then have all tests done again with the mask in place to see if your measurements and recordings change. After the test  A medical doctor who specializes in sleep will evaluate the results of your sleep study and share them with you and your primary health care provider.  Based on your results, your medical history, and a physical exam, you may be diagnosed with a sleep disorder, such as: ? Sleep apnea. ? Restless legs syndrome. ? Sleep-related behavior disorder. ? Sleep-related movement disorders. ? Sleep-related seizure  disorders.  Your health care team will help determine your treatment options based on your diagnosis. This may include: ? Improving your sleep habits (sleep hygiene). ? Wearing a continuous positive airway pressure (CPAP) or bi-level positive airway pressure (BPAP) mask. ? Wearing an oral device at night to improve breathing and reduce snoring. ? Taking medicines. Follow these instructions at home:  Take over-the-counter and prescription medicines only as told by your health care provider.  If you are instructed to use a CPAP or BPAP mask, make sure you use it nightly as directed.  Make any lifestyle changes that your health care provider recommends.  If you were given a device to open your airway while you sleep, use it only as told by  your health care provider.  Do not use any tobacco products, such as cigarettes, chewing tobacco, and e-cigarettes. If you need help quitting, ask your health care provider.  Keep all follow-up visits as told by your health care provider. This is important. Summary  A sleep study (polysomnogram) is a series of tests done while you are sleeping. It shows how well you sleep.  Most sleep studies are done over one full night of sleep. You will arrive at the study center in the evening and go home in the morning.  If you have signs of the sleep disorder called sleep apnea during your test, you may get a treatment mask to wear for the second half of the night.  A medical doctor who specializes in sleep will evaluate the results of your sleep study and share them with your primary health care provider. This information is not intended to replace advice given to you by your health care provider. Make sure you discuss any questions you have with your health care provider. Document Revised: 07/12/2018 Document Reviewed: 02/22/2017 Elsevier Patient Education  [place].  Sleep Apnea Sleep apnea is a condition in which breathing pauses or becomes  shallow during sleep. Episodes of sleep apnea usually last 10 seconds or longer, and they may occur as many as 20 times an hour. Sleep apnea disrupts your sleep and keeps your body from getting the rest that it needs. This condition can increase your risk of certain health problems, including:  Heart attack.  Stroke.  Obesity.  Diabetes.  Heart failure.  Irregular heartbeat. What are the causes? There are three kinds of sleep apnea:  Obstructive sleep apnea. This kind is caused by a blocked or collapsed airway.  Central sleep apnea. This kind happens when the part of the brain that controls breathing does not send the correct signals to the muscles that control breathing.  Mixed sleep apnea. This is a combination of obstructive and central sleep apnea. The most common cause of this condition is a collapsed or blocked airway. An airway can collapse or become blocked if:  Your throat muscles are abnormally relaxed.  Your tongue and tonsils are larger than normal.  You are overweight.  Your airway is smaller than normal. What increases the risk? You are more likely to develop this condition if you:  Are overweight.  Smoke.  Have a smaller than normal airway.  Are elderly.  Are male.  Drink alcohol.  Take sedatives or tranquilizers.  Have a family history of sleep apnea. What are the signs or symptoms? Symptoms of this condition include:  Trouble staying asleep.  Daytime sleepiness and tiredness.  Irritability.  Loud snoring.  Morning headaches.  Trouble concentrating.  Forgetfulness.  Decreased interest in sex.  Unexplained sleepiness.  Mood swings.  Personality changes.  Feelings of depression.  Waking up often during the night to urinate.  Dry mouth.  Sore throat. How is this diagnosed? This condition may be diagnosed with:  A medical history.  A physical exam.  A series of tests that are done while you are sleeping (sleep study).  These tests are usually done in a sleep lab, but they may also be done at home. How is this treated? Treatment for this condition aims to restore normal breathing and to ease symptoms during sleep. It may involve managing health issues that can affect breathing, such as high blood pressure or obesity. Treatment may include:  Sleeping on your side.  Using a decongestant if you  have nasal congestion.  Avoiding the use of depressants, including alcohol, sedatives, and narcotics.  Losing weight if you are overweight.  Making changes to your diet.  Quitting smoking.  Using a device to open your airway while you sleep, such as: ? An oral appliance. This is a custom-made mouthpiece that shifts your lower jaw forward. ? A continuous positive airway pressure (CPAP) device. This device blows air through a mask when you breathe out (exhale). ? A nasal expiratory positive airway pressure (EPAP) device. This device has valves that you put into each nostril. ? A bi-level positive airway pressure (BPAP) device. This device blows air through a mask when you breathe in (inhale) and breathe out (exhale).  Having surgery if other treatments do not work. During surgery, excess tissue is removed to create a wider airway. It is important to get treatment for sleep apnea. Without treatment, this condition can lead to:  High blood pressure.  Coronary artery disease.  In men, an inability to achieve or maintain an erection (impotence).  Reduced thinking abilities. Follow these instructions at home: Lifestyle  Make any lifestyle changes that your health care provider recommends.  Eat a healthy, well-balanced diet.  Take steps to lose weight if you are overweight.  Avoid using depressants, including alcohol, sedatives, and narcotics.  Do not use any products that contain nicotine or tobacco, such as cigarettes, e-cigarettes, and chewing tobacco. If you need help quitting, ask your health care  provider. General instructions  Take over-the-counter and prescription medicines only as told by your health care provider.  If you were given a device to open your airway while you sleep, use it only as told by your health care provider.  If you are having surgery, make sure to tell your health care provider you have sleep apnea. You may need to bring your device with you.  Keep all follow-up visits as told by your health care provider. This is important. Contact a health care provider if:  The device that you received to open your airway during sleep is uncomfortable or does not seem to be working.  Your symptoms do not improve.  Your symptoms get worse. Get help right away if:  You develop: ? Chest pain. ? Shortness of breath. ? Discomfort in your back, arms, or stomach.  You have: ? Trouble speaking. ? Weakness on one side of your body. ? Drooping in your face. These symptoms may represent a serious problem that is an emergency. Do not wait to see if the symptoms will go away. Get medical help right away. Call your local emergency services (911 in the [place] Do not drive yourself to the hospital. Summary  Sleep apnea is a condition in which breathing pauses or becomes shallow during sleep.  The most common cause is a collapsed or blocked airway.  The goal of treatment is to restore normal breathing and to ease symptoms during sleep. This information is not intended to replace advice given to you by your health care provider. Make sure you discuss any questions you have with your health care provider. Document Revised: 07/12/2018 Document Reviewed: 09/20/2017 Elsevier Patient Education  [place].

## 2019-08-30 ENCOUNTER — Other Ambulatory Visit: Payer: Self-pay

## 2019-08-31 ENCOUNTER — Encounter: Payer: Self-pay | Admitting: Family Medicine

## 2019-08-31 ENCOUNTER — Ambulatory Visit (INDEPENDENT_AMBULATORY_CARE_PROVIDER_SITE_OTHER): Payer: Commercial Managed Care - PPO | Admitting: Family Medicine

## 2019-08-31 VITALS — BP 132/80 | HR 80 | Temp 97.8°F | Ht 69.0 in | Wt 251.2 lb

## 2019-08-31 DIAGNOSIS — R7309 Other abnormal glucose: Secondary | ICD-10-CM

## 2019-08-31 DIAGNOSIS — Z0000 Encounter for general adult medical examination without abnormal findings: Secondary | ICD-10-CM | POA: Diagnosis not present

## 2019-08-31 DIAGNOSIS — E559 Vitamin D deficiency, unspecified: Secondary | ICD-10-CM

## 2019-08-31 DIAGNOSIS — R311 Benign essential microscopic hematuria: Secondary | ICD-10-CM

## 2019-08-31 DIAGNOSIS — F5101 Primary insomnia: Secondary | ICD-10-CM | POA: Diagnosis not present

## 2019-08-31 DIAGNOSIS — R0683 Snoring: Secondary | ICD-10-CM

## 2019-08-31 DIAGNOSIS — H6122 Impacted cerumen, left ear: Secondary | ICD-10-CM

## 2019-08-31 LAB — URINALYSIS, ROUTINE W REFLEX MICROSCOPIC
Bilirubin Urine: NEGATIVE
Ketones, ur: NEGATIVE
Leukocytes,Ua: NEGATIVE
Nitrite: NEGATIVE
Specific Gravity, Urine: 1.025 (ref 1.000–1.030)
Total Protein, Urine: NEGATIVE
Urine Glucose: NEGATIVE
Urobilinogen, UA: 0.2 (ref 0.0–1.0)
pH: 5.5 (ref 5.0–8.0)

## 2019-08-31 LAB — LIPID PANEL
Cholesterol: 194 mg/dL (ref 0–200)
HDL: 40.7 mg/dL (ref >39.00)
LDL Cholesterol: 137 mg/dL — ABNORMAL HIGH (ref 0–99)
NonHDL: 153
Total CHOL/HDL Ratio: 5
Triglycerides: 80 mg/dL (ref 0.0–149.0)
VLDL: 16 mg/dL (ref 0.0–40.0)

## 2019-08-31 LAB — CBC
HCT: 46.8 % (ref 39.0–52.0)
Hemoglobin: 15.7 g/dL (ref 13.0–17.0)
MCHC: 33.5 g/dL (ref 30.0–36.0)
MCV: 97.2 fl (ref 78.0–100.0)
Platelets: 263 10*3/uL (ref 150.0–400.0)
RBC: 4.81 Mil/uL (ref 4.22–5.81)
RDW: 13.4 % (ref 11.5–15.5)
WBC: 9.3 10*3/uL (ref 4.0–10.5)

## 2019-08-31 LAB — COMPREHENSIVE METABOLIC PANEL
ALT: 26 U/L (ref 0–53)
AST: 19 U/L (ref 0–37)
Albumin: 4.2 g/dL (ref 3.5–5.2)
Alkaline Phosphatase: 75 U/L (ref 39–117)
BUN: 15 mg/dL (ref 6–23)
CO2: 26 mEq/L (ref 19–32)
Calcium: 9.8 mg/dL (ref 8.4–10.5)
Chloride: 104 mEq/L (ref 96–112)
Creatinine, Ser: 0.86 mg/dL (ref 0.40–1.50)
GFR: 89.8 mL/min (ref >60.00)
Glucose, Bld: 98 mg/dL (ref 70–99)
Potassium: 4.3 mEq/L (ref 3.5–5.1)
Sodium: 138 mEq/L (ref 135–145)
Total Bilirubin: 0.5 mg/dL (ref 0.2–1.2)
Total Protein: 6.9 g/dL (ref 6.0–8.3)

## 2019-08-31 LAB — VITAMIN D 25 HYDROXY (VIT D DEFICIENCY, FRACTURES): VITD: 14.47 ng/mL — ABNORMAL LOW (ref 30.00–100.00)

## 2019-08-31 LAB — HEMOGLOBIN A1C: Hgb A1c MFr Bld: 6.1 % (ref 4.6–6.5)

## 2019-08-31 LAB — LDL CHOLESTEROL, DIRECT: Direct LDL: 132 mg/dL

## 2019-08-31 LAB — PSA: PSA: 1.26 ng/mL (ref 0.10–4.00)

## 2019-08-31 LAB — TSH: TSH: 2.07 u[IU]/mL (ref 0.35–4.50)

## 2019-08-31 MED ORDER — DEBROX 6.5 % OT SOLN: 5.0000 [drp] | Freq: Two times a day (BID) | OTIC | 4 refills | Status: DC

## 2019-08-31 NOTE — Progress Notes (Addendum)
Established Patient Office Visit  Subjective:  Patient ID: Isaiah Miller, male    DOB: 05-27-1955  Age: 64 y.o. MRN: [id number]  CC:  Chief Complaint  Patient presents with  . Annual Exam    CPE, no concerns.     HPI Isaiah Miller presents for a physical exam and follow-up of his insomnia.  Isaiah Miller is helping some but he says that his sleep continues to be not ideal.  Some worrying concern about his daughter and her pregnancy.  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  Active on his job but he is not active other than work.  Never made it to the previously ordered sleep study.  He did have his Covid vaccine.  He does snore.  Past Medical History:  Diagnosis Date  . GERD (gastroesophageal reflux disease)   . Hepatitis    ? EtOH  . Ulcerative colitis    dx 1987 left and then more universal  . Vitamin D deficiency     Past Surgical History:  Procedure Laterality Date  . CHOLECYSTECTOMY    . COLONOSCOPY W/ BIOPSIES  05/2006, 03/2009   ulcerative colitis left and right, no dysplasia  . KNEE SURGERY Right     Family History  Problem Relation Age of Onset  . Diabetes Mother   . Colon cancer Neg Hx   . Rectal cancer Neg Hx   . Stomach cancer Neg Hx   . Esophageal cancer Neg Hx   . Pancreatic cancer Neg Hx   . Colon polyps Neg Hx   . Heart disease Neg Hx   . Kidney disease Neg Hx   . Liver disease Neg Hx     Social History   Socioeconomic History  . Marital status: Married    Spouse name: Not on file  . Number of children: 3  . Years of education: Not on file  . Highest education level: Not on file  Occupational History  . Occupation: Company secretary: [employer]  Tobacco Use  . Smoking status: Never Smoker  . Smokeless tobacco: Never Used  Vaping Use  . Vaping Use: Never used  Substance and Sexual Activity  . Alcohol use: Yes    Alcohol/week: 4.0 - 6.0 standard drinks    Types: 4 - 6 Standard drinks or equivalent per week    Comment:  occasional   . Drug use: No  . Sexual activity: Yes    Comment: married  Other Topics Concern  . Not on file  Social History Narrative   Occasional caffeine    Social Determinants of Health   Financial Resource Strain:   . Difficulty of Paying Living Expenses:   Food Insecurity:   . Worried About Charity fundraiser in the Last Year:   . Arboriculturist in the Last Year:   Transportation Needs:   . Film/video editor (Medical):   Marland Kitchen Lack of Transportation (Non-Medical):   Physical Activity:   . Days of Exercise per Week:   . Minutes of Exercise per Session:   Stress:   . Feeling of Stress :   Social Connections:   . Frequency of Communication with Friends and Family:   . Frequency of Social Gatherings with Friends and Family:   . Attends Religious Services:   . Active Member of Clubs or Organizations:   . Attends Archivist Meetings:   Marland Kitchen Marital Status:   Intimate Partner Violence:   . Fear of  Current or Ex-Partner:   . Emotionally Abused:   Marland Kitchen Physically Abused:   . Sexually Abused:     Outpatient Medications Prior to Visit  Medication Sig Dispense Refill  . celecoxib (CELEBREX) 200 MG capsule Take 1 capsule (200 mg total) by mouth 2 (two) times daily as needed for moderate pain. 180 capsule 1  . eszopiclone (LUNESTA) 1 MG TABS tablet Take 1 tablet (1 mg total) by mouth at bedtime as needed for sleep. Take immediately before bedtime 30 tablet 1  . mesalamine (LIALDA) 1.2 g EC tablet Take 2 tablets (2.4 g total) by mouth 2 (two) times daily. 360 tablet 2   No facility-administered medications prior to visit.    No Known Allergies  ROS Review of Systems  Constitutional: Negative.   Eyes: Negative for photophobia and visual disturbance.  Respiratory: Negative.   Cardiovascular: Negative.   Gastrointestinal: Negative.   Endocrine: Negative for polyphagia and polyuria.  Genitourinary: Negative for difficulty urinating, frequency and urgency.    Musculoskeletal: Negative for gait problem.  Skin: Negative for pallor and rash.  Allergic/Immunologic: Negative for immunocompromised state.  Neurological: Negative for speech difficulty and light-headedness.  Hematological: Does not bruise/bleed easily.  Psychiatric/Behavioral: Negative.    Depression screen [facility] 2/9 08/31/2019 08/31/2019 07/06/2019  Decreased Interest 1 0 0  Down, Depressed, Hopeless 0 0 0  PHQ - 2 Score 1 0 0  Altered sleeping 1 - 2  Tired, decreased energy 0 - 1  Change in appetite 1 - 1  Feeling bad or failure about yourself  0 - 0  Trouble concentrating 0 - 1  Moving slowly or fidgety/restless 0 - 0  Suicidal thoughts 0 - 0  PHQ-9 Score 3 - 5  Difficult doing work/chores Somewhat difficult - Somewhat difficult      Objective:    Physical Exam Constitutional:      General: He is not in acute distress.    Appearance: Normal appearance. He is obese. He is not ill-appearing, toxic-appearing or diaphoretic.  HENT:     Head: Normocephalic and atraumatic.     Right Ear: There is impacted cerumen.     Left Ear: There is impacted cerumen.     Mouth/Throat:     Mouth: Mucous membranes are moist.     Pharynx: Oropharynx is clear. No oropharyngeal exudate or posterior oropharyngeal erythema.  Eyes:     General: No scleral icterus.       Right eye: No discharge.        Left eye: No discharge.     Extraocular Movements: Extraocular movements intact.     Conjunctiva/sclera: Conjunctivae normal.     Pupils: Pupils are equal, round, and reactive to light.  Cardiovascular:     Rate and Rhythm: Normal rate and regular rhythm.  Pulmonary:     Effort: Pulmonary effort is normal.     Breath sounds: Normal breath sounds.  Abdominal:     General: Abdomen is flat. Bowel sounds are normal. There is distension.     Palpations: Abdomen is soft. There is no mass.     Tenderness: There is no abdominal tenderness. There is no guarding or rebound.     Hernia: No hernia is  present. There is no hernia in the left inguinal area or right inguinal area.  Genitourinary:    Penis: Circumcised. No hypospadias, erythema, tenderness, discharge, swelling or lesions.      Testes:        Right: Mass, tenderness or swelling not  present. Right testis is descended.        Left: Mass, tenderness or swelling not present. Left testis is descended.     Epididymis:     Right: Not inflamed or enlarged.     Left: Not inflamed or enlarged.     Prostate: Enlarged. No nodules present.     Rectum: Guaiac result negative. No mass, tenderness, anal fissure, external hemorrhoid or internal hemorrhoid. Normal anal tone.  Musculoskeletal:     Cervical back: No rigidity or tenderness.     Right lower leg: No edema.     Left lower leg: No edema.  Lymphadenopathy:     Cervical: No cervical adenopathy.     Lower Body: No right inguinal adenopathy. No left inguinal adenopathy.  Skin:    General: Skin is warm and dry.     Coloration: Skin is not jaundiced.  Neurological:     Mental Status: He is alert and oriented to person, place, and time.  Psychiatric:        Mood and Affect: Mood normal.        Behavior: Behavior normal.     BP (!) 132/80   Pulse 80   Temp 97.8 F (36.6 C) (Tympanic)   Ht 5' 9"  (1.753 m)   Wt (!) 251 lb 3.2 oz (113.9 kg)   SpO2 95%   BMI 37.10 kg/m  Wt Readings from Last 3 Encounters:  08/31/19 (!) 251 lb 3.2 oz (113.9 kg)  07/06/19 251 lb 9.6 oz (114.1 kg)  05/04/19 243 lb (110.2 kg)     Health Maintenance Due  Topic Date Due  . Hepatitis C Screening  Never done  . HIV Screening  Never done  . INFLUENZA VACCINE  09/09/2019    There are no preventive care reminders to display for this patient.  Lab Results  Component Value Date   TSH 2.07 08/31/2019   Lab Results  Component Value Date   WBC 9.3 08/31/2019   HGB 15.7 08/31/2019   HCT 46.8 08/31/2019   MCV 97.2 08/31/2019   PLT 263.0 08/31/2019   Lab Results  Component Value Date   NA  138 08/31/2019   K 4.3 08/31/2019   CO2 26 08/31/2019   GLUCOSE 98 08/31/2019   BUN 15 08/31/2019   CREATININE 0.86 08/31/2019   BILITOT 0.5 08/31/2019   ALKPHOS 75 08/31/2019   AST 19 08/31/2019   ALT 26 08/31/2019   PROT 6.9 08/31/2019   ALBUMIN 4.2 08/31/2019   CALCIUM 9.8 08/31/2019   ANIONGAP 14 11/30/2013   GFR 89.80 08/31/2019   Lab Results  Component Value Date   CHOL 194 08/31/2019   Lab Results  Component Value Date   HDL 40.70 08/31/2019   Lab Results  Component Value Date   LDLCALC 137 (H) 08/31/2019   Lab Results  Component Value Date   TRIG 80.0 08/31/2019   Lab Results  Component Value Date   CHOLHDL 5 08/31/2019   Lab Results  Component Value Date   HGBA1C 6.1 08/31/2019      Assessment & Plan:   Problem List Items Addressed This Visit      Nervous and Auditory   Impacted cerumen of left ear   Relevant Medications   carbamide peroxide (DEBROX) 6.5 % OTIC solution     Other   Vitamin D deficiency   Relevant Medications   Vitamin D, Ergocalciferol, (DRISDOL) 1.25 MG (50000 UNIT) CAPS capsule   Other Relevant Orders   VITAMIN D  25 Hydroxy (Vit-D Deficiency, Fractures) (Completed)   Morbid obesity (HCC)   Well adult exam   Relevant Orders   CBC (Completed)   Comprehensive metabolic panel (Completed)   LDL cholesterol, direct (Completed)   Lipid panel (Completed)   PSA (Completed)   Urinalysis, Routine w reflex microscopic (Completed)   TSH (Completed)   Primary insomnia - Primary   Snores   Relevant Orders   Ambulatory referral to Pulmonology   Elevated glucose   Relevant Orders   Hemoglobin A1c (Completed)    Other Visit Diagnoses    Benign essential microscopic hematuria       Relevant Orders   Urinalysis, Routine w reflex microscopic      Meds ordered this encounter  Medications  . carbamide peroxide (DEBROX) 6.5 % OTIC solution    Sig: Place 5 drops into both ears 2 (two) times daily.    Dispense:  15 mL    Refill:   4  . Vitamin D, Ergocalciferol, (DRISDOL) 1.25 MG (50000 UNIT) CAPS capsule    Sig: Take 1 capsule (50,000 Units total) by mouth every 7 (seven) days.    Dispense:  5 capsule    Refill:  5    Follow-up: Return in about 6 months (around 03/02/2020).  Given information on health maintenance disease prevention, BMI, calorie counting to lose weight, information on bariatric surgery and earwax buildup.  Referred him again for sleep study.  He has apnea.  Advised him to start exercising by walking for 30 minutes at least 5 days of work.  Physical activity is more than just what he gets on his job.  Libby Maw, MD

## 2019-08-31 NOTE — Patient Instructions (Addendum)
Earwax Buildup, Adult The ears produce a substance called earwax that helps keep bacteria out of the ear and protects the skin in the ear canal. Occasionally, earwax can build up in the ear and cause discomfort or hearing loss. What increases the risk? This condition is more likely to develop in people who:  Are male.  Are elderly.  Naturally produce more earwax.  Clean their ears often with cotton swabs.  Use earplugs often.  Use in-ear headphones often.  Wear hearing aids.  Have narrow ear canals.  Have earwax that is overly thick or sticky.  Have eczema.  Are dehydrated.  Have excess hair in the ear canal. What are the signs or symptoms? Symptoms of this condition include:  Reduced or muffled hearing.  A feeling of fullness in the ear or feeling that the ear is plugged.  Fluid coming from the ear.  Ear pain.  Ear itch.  Ringing in the ear.  Coughing.  An obvious piece of earwax that can be seen inside the ear canal. How is this diagnosed? This condition may be diagnosed based on:  Your symptoms.  Your medical history.  An ear exam. During the exam, your health care provider will look into your ear with an instrument called an otoscope. You may have tests, including a hearing test. How is this treated? This condition may be treated by:  Using ear drops to soften the earwax.  Having the earwax removed by a health care provider. The health care provider may: ? Flush the ear with water. ? Use an instrument that has a loop on the end (curette). ? Use a suction device.  Surgery to remove the wax buildup. This may be done in severe cases. Follow these instructions at home:   Take over-the-counter and prescription medicines only as told by your health care provider.  Do not put any objects, including cotton swabs, into your ear. You can clean the opening of your ear canal with a washcloth or facial tissue.  Follow instructions from your health care  provider about cleaning your ears. Do not over-clean your ears.  Drink enough fluid to keep your urine clear or pale yellow. This will help to thin the earwax.  Keep all follow-up visits as told by your health care provider. If earwax builds up in your ears often or if you use hearing aids, consider seeing your health care provider for routine, preventive ear cleanings. Ask your health care provider how often you should schedule your cleanings.  If you have hearing aids, clean them according to instructions from the manufacturer and your health care provider. Contact a health care provider if:  You have ear pain.  You develop a fever.  You have blood, pus, or other fluid coming from your ear.  You have hearing loss.  You have ringing in your ears that does not go away.  Your symptoms do not improve with treatment.  You feel like the room is spinning (vertigo). Summary  Earwax can build up in the ear and cause discomfort or hearing loss.  The most common symptoms of this condition include reduced or muffled hearing and a feeling of fullness in the ear or feeling that the ear is plugged.  This condition may be diagnosed based on your symptoms, your medical history, and an ear exam.  This condition may be treated by using ear drops to soften the earwax or by having the earwax removed by a health care provider.  Do not put any  objects, including cotton swabs, into your ear. You can clean the opening of your ear canal with a washcloth or facial tissue. This information is not intended to replace advice given to you by your health care provider. Make sure you discuss any questions you have with your health care provider. Document Revised: 01/07/2017 Document Reviewed: 04/07/2016 Elsevier Patient Education  2020 [place].  BMI for Adults What is BMI? Body mass index (BMI) is a number that is calculated from a person's weight and height. BMI can help estimate how much of a  person's weight is composed of fat. BMI does not measure body fat directly. Rather, it is an alternative to procedures that directly measure body fat, which can be difficult and expensive. BMI can help identify people who may be at higher risk for certain medical problems. What are BMI measurements used for? BMI is used as a screening tool to identify possible weight problems. It helps determine whether a person is obese, overweight, a healthy weight, or underweight. BMI is useful for:  Identifying a weight problem that may be related to a medical condition or may increase the risk for medical problems.  Promoting changes, such as changes in diet and exercise, to help reach a healthy weight. BMI screening can be repeated to see if these changes are working. How is BMI calculated? BMI involves measuring your weight in relation to your height. Both height and weight are measured, and the BMI is calculated from those numbers. This can be done either in [place] ([place] or metric measurements. Note that charts and online BMI calculators are available to help you find your BMI quickly and easily without having to do these calculations yourself. To calculate your BMI in [place] ([place] measurements:  1. Measure your weight in pounds (lb). 2. Multiply the number of pounds by 703. ? For example, for a person who weighs 180 lb, multiply that number by 703, which equals 126,540. 3. Measure your height in inches. Then multiply that number by itself to get a measurement called "inches squared." ? For example, for a person who is 70 inches tall, the "inches squared" measurement is 70 inches x 70 inches, which equals 4,900 inches squared. 4. Divide the total from step 2 (number of lb x 703) by the total from step 3 (inches squared): 126,540  4,900 = 25.8. This is your BMI. To calculate your BMI in metric measurements: 1. Measure your weight in kilograms (kg). 2. Measure your height in meters (m). Then multiply  that number by itself to get a measurement called "meters squared." ? For example, for a person who is 1.75 m tall, the "meters squared" measurement is 1.75 m x 1.75 m, which is equal to 3.1 meters squared. 3. Divide the number of kilograms (your weight) by the meters squared number. In this example: 70  3.1 = 22.6. This is your BMI. What do the results mean? BMI charts are used to identify whether you are underweight, normal weight, overweight, or obese. The following guidelines will be used:  Underweight: BMI less than 18.5.  Normal weight: BMI between 18.5 and 24.9.  Overweight: BMI between 25 and 29.9.  Obese: BMI of 30 or above. Keep these notes in mind:  Weight includes both fat and muscle, so someone with a muscular build, such as an athlete, may have a BMI that is higher than 24.9. In cases like these, BMI is not an accurate measure of body fat.  To determine if excess body fat  is the cause of a BMI of 25 or higher, further assessments may need to be done by a health care provider.  BMI is usually interpreted in the same way for men and women. Where to find more information For more information about BMI, including tools to quickly calculate your BMI, go to these websites:  Centers for Disease Control and Prevention: http://www.wolf.info/  American Heart Association: www.heart.org  National Heart, Lung, and Blood Institute: https://wilson-eaton.com/ Summary  Body mass index (BMI) is a number that is calculated from a person's weight and height.  BMI may help estimate how much of a person's weight is composed of fat. BMI can help identify those who may be at higher risk for certain medical problems.  BMI can be measured using English measurements or metric measurements.  BMI charts are used to identify whether you are underweight, normal weight, overweight, or obese. This information is not intended to replace advice given to you by your health care provider. Make sure you discuss any  questions you have with your health care provider. Document Revised: 10/18/2018 Document Reviewed: 08/25/2018 Elsevier Patient Education  [place].  Bariatric Surgery Information Bariatric surgery, also called weight loss surgery, is a procedure that helps you lose weight. You may consider, or your health care provider may suggest, bariatric surgery if:  You are severely obese and have been unable to lose weight through diet and exercise.  You have health problems related to obesity, such as: ? Type 2 diabetes. ? Heart disease. ? Lung disease. How does bariatric surgery help me lose weight? Bariatric surgery helps you lose weight by:  Decreasing how much food your body absorbs. This is done by closing off part of your stomach to make it smaller. This restricts the amount of food your stomach can hold.  Changing your body's regular digestive process so that food bypasses the parts of your body that absorb calories and nutrients. If you decide to have bariatric surgery, it is important to continue to eat a healthy diet and exercise regularly after the surgery. What are the different kinds of bariatric surgery? There are two kinds of bariatric surgeries:  Restrictive surgery. This procedure makes your stomach smaller. It does not change your digestive process. The smaller the size of your new stomach, the less food you can eat. There are different types of restrictive surgeries.  Malabsorptive surgery. This procedure makes your stomach smaller and alters your digestive process so that your body processes less calories and nutrients. These are the most common kind of bariatric surgery. There are different types of malabsorptive surgeries. What are the different types of restrictive surgery? Adjustable Gastric Banding In this procedure, an inflatable band is placed around your stomach near the upper end. This makes the passageway for food into the rest of your stomach much smaller.  The band can be adjusted, making it tighter or looser, by filling it with salt solution. Your surgeon can adjust the band based on how you are feeling and how much weight you are losing. The band can be removed in the future. This requires another surgery. Sleeve Gastrectomy In this procedure, your stomach is made smaller. This is done by surgically removing a large part of your stomach. When your stomach is smaller, you feel full more quickly and reduce how much you eat. What are the different types of malabsorptive surgery?  Roux-en-Y Gastric Bypass (RGB) This is the most common weight loss surgery. In this procedure, a small stomach pouch (gastric  pouch) is created in the upper part of your stomach. Next, this gastric pouch is attached directly to the middle part of your small intestine. The farther down your small intestine the new connection is made, the fewer calories and nutrients you will absorb. This surgery has the highest rate of complications. Biliopancreatic Diversion with Duodenal Switch (BPD/DS) This is a multi-step procedure. First, a large part of your stomach is removed, making your stomach smaller. Next, this smaller stomach is attached to the lower part of your small intestine. Like the RGB surgery, you absorb fewer calories and nutrients the farther down your small intestine the attachment is made. What are the risks of bariatric surgery? As with any surgical procedure, each type of bariatric surgery has its own risks. These risks also depend on your age, your overall health, and any other medical conditions you may have. When deciding on bariatric surgery, it is very important to:  Talk to your health care provider and choose the surgery that is best for you.  Ask your health care provider about specific risks for the surgery you choose. Generally, the risks of bariatric surgery include:  Infection.  Bleeding.  Not getting enough nutrients from food (nutritional  deficiencies).  Failure of the device or procedure. This may require another surgery to correct the problem. Where to find more information  American Society for Metabolic & Bariatric Surgery: www.asmbs.org  Weight-control Information Network (WIN): win.AmenCredit.is Summary  Bariatric surgery, also called weight loss surgery, is a procedure that helps you lose weight.  This surgery may be recommended if you have diabetes, heart disease, or lung disease.  Generally, risks of bariatric surgery include infection, bleeding, and failure of the surgery or device, which may require another surgery to correct the problem. This information is not intended to replace advice given to you by your health care provider. Make sure you discuss any questions you have with your health care provider. Document Revised: 05/16/2018 Document Reviewed: 03/01/2016 Elsevier Patient Education  2020 [place] Maintenance, Male Adopting a healthy lifestyle and getting preventive care are important in promoting health and wellness. Ask your health care provider about:  The right schedule for you to have regular tests and exams.  Things you can do on your own to prevent diseases and keep yourself healthy. What should I know about diet, weight, and exercise? Eat a healthy diet   Eat a diet that includes plenty of vegetables, fruits, low-fat dairy products, and lean protein.  Do not eat a lot of foods that are high in solid fats, added sugars, or sodium. Maintain a healthy weight Body mass index (BMI) is a measurement that can be used to identify possible weight problems. It estimates body fat based on height and weight. Your health care provider can help determine your BMI and help you achieve or maintain a healthy weight. Get regular exercise Get regular exercise. This is one of the most important things you can do for your health. Most adults should:  Exercise for at least 150 minutes each week.  The exercise should increase your heart rate and make you sweat (moderate-intensity exercise).  Do strengthening exercises at least twice a week. This is in addition to the moderate-intensity exercise.  Spend less time sitting. Even light physical activity can be beneficial. Watch cholesterol and blood lipids Have your blood tested for lipids and cholesterol at 63 years of age, then have this test every 5 years. You may need to have your cholesterol levels  checked more often if:  Your lipid or cholesterol levels are high.  You are older than 63 years of age.  You are at high risk for heart disease. What should I know about cancer screening? Many types of cancers can be detected early and may often be prevented. Depending on your health history and family history, you may need to have cancer screening at various ages. This may include screening for:  Colorectal cancer.  Prostate cancer.  Skin cancer.  Lung cancer. What should I know about heart disease, diabetes, and high blood pressure? Blood pressure and heart disease  High blood pressure causes heart disease and increases the risk of stroke. This is more likely to develop in people who have high blood pressure readings, are of African descent, or are overweight.  Talk with your health care provider about your target blood pressure readings.  Have your blood pressure checked: ? Every 3-5 years if you are 38-5 years of age. ? Every year if you are 3 years old or older.  If you are between the ages of 51 and 26 and are a current or former smoker, ask your health care provider if you should have a one-time screening for abdominal aortic aneurysm (AAA). Diabetes Have regular diabetes screenings. This checks your fasting blood sugar level. Have the screening done:  Once every three years after age 57 if you are at a normal weight and have a low risk for diabetes.  More often and at a younger age if you are overweight or have a  high risk for diabetes. What should I know about preventing infection? Hepatitis B If you have a higher risk for hepatitis B, you should be screened for this virus. Talk with your health care provider to find out if you are at risk for hepatitis B infection. Hepatitis C Blood testing is recommended for:  Everyone born from [birth year] through 1964.  Anyone with known risk factors for hepatitis C. Sexually transmitted infections (STIs)  You should be screened each year for STIs, including gonorrhea and chlamydia, if: ? You are sexually active and are younger than 63 years of age. ? You are older than 63 years of age and your health care provider tells you that you are at risk for this type of infection. ? Your sexual activity has changed since you were last screened, and you are at increased risk for chlamydia or gonorrhea. Ask your health care provider if you are at risk.  Ask your health care provider about whether you are at high risk for HIV. Your health care provider may recommend a prescription medicine to help prevent HIV infection. If you choose to take medicine to prevent HIV, you should first get tested for HIV. You should then be tested every 3 months for as long as you are taking the medicine. Follow these instructions at home: Lifestyle  Do not use any products that contain nicotine or tobacco, such as cigarettes, e-cigarettes, and chewing tobacco. If you need help quitting, ask your health care provider.  Do not use street drugs.  Do not share needles.  Ask your health care provider for help if you need support or information about quitting drugs. Alcohol use  Do not drink alcohol if your health care provider tells you not to drink.  If you drink alcohol: ? Limit how much you have to 0-2 drinks a day. ? Be aware of how much alcohol is in your drink. In the [place] one drink equals one 12  oz bottle of beer (355 mL), one 5 oz glass of wine (148 mL), or one 1 oz glass of hard  liquor (44 mL). General instructions  Schedule regular health, dental, and eye exams.  Stay current with your vaccines.  Tell your health care provider if: ? You often feel depressed. ? You have ever been abused or do not feel safe at home. Summary  Adopting a healthy lifestyle and getting preventive care are important in promoting health and wellness.  Follow your health care provider's instructions about healthy diet, exercising, and getting tested or screened for diseases.  Follow your health care provider's instructions on monitoring your cholesterol and blood pressure. This information is not intended to replace advice given to you by your health care provider. Make sure you discuss any questions you have with your health care provider. Document Revised: 01/18/2018 Document Reviewed: 01/18/2018 Elsevier Patient Education  2020 Elsevier Inc.  Preventive Care 65-14 Years Old, Male Preventive care refers to lifestyle choices and visits with your health care provider that can promote health and wellness. This includes:  A yearly physical exam. This is also called an annual well check.  Regular dental and eye exams.  Immunizations.  Screening for certain conditions.  Healthy lifestyle choices, such as eating a healthy diet, getting regular exercise, not using drugs or products that contain nicotine and tobacco, and limiting alcohol use. What can I expect for my preventive care visit? Physical exam Your health care provider will check:  Height and weight. These may be used to calculate body mass index (BMI), which is a measurement that tells if you are at a healthy weight.  Heart rate and blood pressure.  Your skin for abnormal spots. Counseling Your health care provider may ask you questions about:  Alcohol, tobacco, and drug use.  Emotional well-being.  Home and relationship well-being.  Sexual activity.  Eating habits.  Work and work Statistician. What  immunizations do I need?  Influenza (flu) vaccine  This is recommended every year. Tetanus, diphtheria, and pertussis (Tdap) vaccine  You may need a Td booster every 10 years. Varicella (chickenpox) vaccine  You may need this vaccine if you have not already been vaccinated. Zoster (shingles) vaccine  You may need this after age 25. Measles, mumps, and rubella (MMR) vaccine  You may need at least one dose of MMR if you were born in 1957 or later. You may also need a second dose. Pneumococcal conjugate (PCV13) vaccine  You may need this if you have certain conditions and were not previously vaccinated. Pneumococcal polysaccharide (PPSV23) vaccine  You may need one or two doses if you smoke cigarettes or if you have certain conditions. Meningococcal conjugate (MenACWY) vaccine  You may need this if you have certain conditions. Hepatitis A vaccine  You may need this if you have certain conditions or if you travel or work in places where you may be exposed to hepatitis A. Hepatitis B vaccine  You may need this if you have certain conditions or if you travel or work in places where you may be exposed to hepatitis B. Haemophilus influenzae type b (Hib) vaccine  You may need this if you have certain risk factors. Human papillomavirus (HPV) vaccine  If recommended by your health care provider, you may need three doses over 6 months. You may receive vaccines as individual doses or as more than one vaccine together in one shot (combination vaccines). Talk with your health care provider about the risks and benefits of  combination vaccines. What tests do I need? Blood tests  Lipid and cholesterol levels. These may be checked every 5 years, or more frequently if you are over 72 years old.  Hepatitis C test.  Hepatitis B test. Screening  Lung cancer screening. You may have this screening every year starting at age 50 if you have a 30-pack-year history of smoking and currently smoke  or have quit within the past 15 years.  Prostate cancer screening. Recommendations will vary depending on your family history and other risks.  Colorectal cancer screening. All adults should have this screening starting at age 60 and continuing until age 44. Your health care provider may recommend screening at age 25 if you are at increased risk. You will have tests every 1-10 years, depending on your results and the type of screening test.  Diabetes screening. This is done by checking your blood sugar (glucose) after you have not eaten for a while (fasting). You may have this done every 1-3 years.  Sexually transmitted disease (STD) testing. Follow these instructions at home: Eating and drinking  Eat a diet that includes fresh fruits and vegetables, whole grains, lean protein, and low-fat dairy products.  Take vitamin and mineral supplements as recommended by your health care provider.  Do not drink alcohol if your health care provider tells you not to drink.  If you drink alcohol: ? Limit how much you have to 0-2 drinks a day. ? Be aware of how much alcohol is in your drink. In the [place] one drink equals one 12 oz bottle of beer (355 mL), one 5 oz glass of wine (148 mL), or one 1 oz glass of hard liquor (44 mL). Lifestyle  Take daily care of your teeth and gums.  Stay active. Exercise for at least 30 minutes on 5 or more days each week.  Do not use any products that contain nicotine or tobacco, such as cigarettes, e-cigarettes, and chewing tobacco. If you need help quitting, ask your health care provider.  If you are sexually active, practice safe sex. Use a condom or other form of protection to prevent STIs (sexually transmitted infections).  Talk with your health care provider about taking a low-dose aspirin every day starting at age 69. What's next?  Go to your health care provider once a year for a well check visit.  Ask your health care provider how often you should have  your eyes and teeth checked.  Stay up to date on all vaccines. This information is not intended to replace advice given to you by your health care provider. Make sure you discuss any questions you have with your health care provider. Document Revised: 01/19/2018 Document Reviewed: 01/19/2018 Elsevier Patient Education  2020 [place] [employer] Loss Calories are units of energy. Your body needs a certain amount of calories from food to keep you going throughout the day. When you eat more calories than your body needs, your body stores the extra calories as fat. When you eat fewer calories than your body needs, your body burns fat to get the energy it needs. Calorie counting means keeping track of how many calories you eat and drink each day. Calorie counting can be helpful if you need to lose weight. If you make sure to eat fewer calories than your body needs, you should lose weight. Ask your health care provider what a healthy weight is for you. For calorie counting to work, you will need to eat the right number  of calories in a day in order to lose a healthy amount of weight per week. A dietitian can help you determine how many calories you need in a day and will give you suggestions on how to reach your calorie goal.  A healthy amount of weight to lose per week is usually 1-2 lb (0.5-0.9 kg). This usually means that your daily calorie intake should be reduced by 500-750 calories.  Eating 1,200 - 1,500 calories per day can help most women lose weight.  Eating 1,500 - 1,800 calories per day can help most men lose weight. What is my plan? My goal is to have __________ calories per day. If I have this many calories per day, I should lose around __________ pounds per week. What do I need to know about calorie counting? In order to meet your daily calorie goal, you will need to:  Find out how many calories are in each food you would like to eat. Try to do this before you  eat.  Decide how much of the food you plan to eat.  Write down what you ate and how many calories it had. Doing this is called keeping a food log. To successfully lose weight, it is important to balance calorie counting with a healthy lifestyle that includes regular activity. Aim for 150 minutes of moderate exercise (such as walking) or 75 minutes of vigorous exercise (such as running) each week. Where do I find calorie information?  The number of calories in a food can be found on a Nutrition Facts label. If a food does not have a Nutrition Facts label, try to look up the calories online or ask your dietitian for help. Remember that calories are listed per serving. If you choose to have more than one serving of a food, you will have to multiply the calories per serving by the amount of servings you plan to eat. For example, the label on a package of bread might say that a serving size is 1 slice and that there are 90 calories in a serving. If you eat 1 slice, you will have eaten 90 calories. If you eat 2 slices, you will have eaten 180 calories. How do I keep a food log? Immediately after each meal, record the following information in your food log:  What you ate. Don't forget to include toppings, sauces, and other extras on the food.  How much you ate. This can be measured in cups, ounces, or number of items.  How many calories each food and drink had.  The total number of calories in the meal. Keep your food log near you, such as in a small notebook in your pocket, or use a mobile app or website. Some programs will calculate calories for you and show you how many calories you have left for the day to meet your goal. What are some calorie counting tips?   Use your calories on foods and drinks that will fill you up and not leave you hungry: ? Some examples of foods that fill you up are nuts and nut butters, vegetables, lean proteins, and high-fiber foods like whole grains. High-fiber foods  are foods with more than 5 g fiber per serving. ? Drinks such as sodas, specialty coffee drinks, alcohol, and juices have a lot of calories, yet do not fill you up.  Eat nutritious foods and avoid empty calories. Empty calories are calories you get from foods or beverages that do not have many vitamins or protein, such as  candy, sweets, and soda. It is better to have a nutritious high-calorie food (such as an avocado) than a food with few nutrients (such as a bag of chips).  Know how many calories are in the foods you eat most often. This will help you calculate calorie counts faster.  Pay attention to calories in drinks. Low-calorie drinks include water and unsweetened drinks.  Pay attention to nutrition labels for "low fat" or "fat free" foods. These foods sometimes have the same amount of calories or more calories than the full fat versions. They also often have added sugar, starch, or salt, to make up for flavor that was removed with the fat.  Find a way of tracking calories that works for you. Get creative. Try different apps or programs if writing down calories does not work for you. What are some portion control tips?  Know how many calories are in a serving. This will help you know how many servings of a certain food you can have.  Use a measuring cup to measure serving sizes. You could also try weighing out portions on a kitchen scale. With time, you will be able to estimate serving sizes for some foods.  Take some time to put servings of different foods on your favorite plates, bowls, and cups so you know what a serving looks like.  Try not to eat straight from a bag or box. Doing this can lead to overeating. Put the amount you would like to eat in a cup or on a plate to make sure you are eating the right portion.  Use smaller plates, glasses, and bowls to prevent overeating.  Try not to multitask (for example, watch TV or use your computer) while eating. If it is time to eat, sit  down at a table and enjoy your food. This will help you to know when you are full. It will also help you to be aware of what you are eating and how much you are eating. What are tips for following this plan? Reading food labels  Check the calorie count compared to the serving size. The serving size may be smaller than what you are used to eating.  Check the source of the calories. Make sure the food you are eating is high in vitamins and protein and low in saturated and trans fats. Shopping  Read nutrition labels while you shop. This will help you make healthy decisions before you decide to purchase your food.  Make a grocery list and stick to it. Cooking  Try to cook your favorite foods in a healthier way. For example, try baking instead of frying.  Use low-fat dairy products. Meal planning  Use more fruits and vegetables. Half of your plate should be fruits and vegetables.  Include lean proteins like poultry and fish. How do I count calories when eating out?  Ask for smaller portion sizes.  Consider sharing an entree and sides instead of getting your own entree.  If you get your own entree, eat only half. Ask for a box at the beginning of your meal and put the rest of your entree in it so you are not tempted to eat it.  If calories are listed on the menu, choose the lower calorie options.  Choose dishes that include vegetables, fruits, whole grains, low-fat dairy products, and lean protein.  Choose items that are boiled, broiled, grilled, or steamed. Stay away from items that are buttered, battered, fried, or served with cream sauce. Items labeled "crispy" are  usually fried, unless stated otherwise.  Choose water, low-fat milk, unsweetened iced tea, or other drinks without added sugar. If you want an alcoholic beverage, choose a lower calorie option such as a glass of wine or light beer.  Ask for dressings, sauces, and syrups on the side. These are usually high in calories, so  you should limit the amount you eat.  If you want a salad, choose a garden salad and ask for grilled meats. Avoid extra toppings like bacon, cheese, or fried items. Ask for the dressing on the side, or ask for olive oil and vinegar or lemon to use as dressing.  Estimate how many servings of a food you are given. For example, a serving of cooked rice is  cup or about the size of half a baseball. Knowing serving sizes will help you be aware of how much food you are eating at restaurants. The list below tells you how big or small some common portion sizes are based on everyday objects: ? 1 oz--4 stacked dice. ? 3 oz--1 deck of cards. ? 1 tsp--1 die. ? 1 Tbsp-- a ping-pong ball. ? 2 Tbsp--1 ping-pong ball. ?  cup-- baseball. ? 1 cup--1 baseball. Summary  Calorie counting means keeping track of how many calories you eat and drink each day. If you eat fewer calories than your body needs, you should lose weight.  A healthy amount of weight to lose per week is usually 1-2 lb (0.5-0.9 kg). This usually means reducing your daily calorie intake by 500-750 calories.  The number of calories in a food can be found on a Nutrition Facts label. If a food does not have a Nutrition Facts label, try to look up the calories online or ask your dietitian for help.  Use your calories on foods and drinks that will fill you up, and not on foods and drinks that will leave you hungry.  Use smaller plates, glasses, and bowls to prevent overeating. This information is not intended to replace advice given to you by your health care provider. Make sure you discuss any questions you have with your health care provider. Document Revised: 10/14/2017 Document Reviewed: 12/26/2015 Elsevier Patient Education  [place].

## 2019-09-10 MED ORDER — VITAMIN D (ERGOCALCIFEROL) 1.25 MG (50000 UNIT) PO CAPS: 50000.0000 [IU] | ORAL_CAPSULE | ORAL | 5 refills | Status: DC

## 2019-09-10 NOTE — Addendum Note (Signed)
Addended by: Jon Billings on: 09/10/2019 07:29 AM   Modules accepted: Orders

## 2019-09-14 ENCOUNTER — Ambulatory Visit: Payer: Commercial Managed Care - PPO | Admitting: Family Medicine

## 2019-09-23 ENCOUNTER — Other Ambulatory Visit: Payer: Self-pay | Admitting: Family Medicine

## 2019-09-23 DIAGNOSIS — F5101 Primary insomnia: Secondary | ICD-10-CM

## 2019-11-05 ENCOUNTER — Institutional Professional Consult (permissible substitution): Payer: Commercial Managed Care - PPO | Admitting: Pulmonary Disease

## 2019-12-10 ENCOUNTER — Other Ambulatory Visit: Payer: Commercial Managed Care - PPO

## 2019-12-10 ENCOUNTER — Other Ambulatory Visit: Payer: Self-pay | Admitting: Internal Medicine

## 2019-12-10 ENCOUNTER — Telehealth: Payer: Self-pay | Admitting: Internal Medicine

## 2019-12-10 DIAGNOSIS — R197 Diarrhea, unspecified: Secondary | ICD-10-CM

## 2019-12-10 DIAGNOSIS — K5100 Ulcerative (chronic) pancolitis without complications: Secondary | ICD-10-CM

## 2019-12-10 MED ORDER — PREDNISONE 10 MG PO TABS
ORAL_TABLET | ORAL | 0 refills | Status: AC
Start: 2019-12-10 — End: 2020-02-11

## 2019-12-10 NOTE — Telephone Encounter (Signed)
Patient called requesting Prednisone medication for his flare up please advise

## 2019-12-10 NOTE — Telephone Encounter (Signed)
Patient notified of the recommendations Follow up arranged for Dec rx sent New lab orders placed

## 2019-12-10 NOTE — Telephone Encounter (Signed)
Patient reports that he has had a 1 week history of cramping , diarrhea, denies any bleeding.  He reports that the diarrhea has improved some.  He reports that he has a constant urge to defecate.  He is maintained on lialda 2.4 mg BID.     Dr. Havery Moros, you are MD of the day.  Patient with similar symptoms this Spring that responded to prednisone taper.  Please review in Dr. Celesta Aver absence.

## 2019-12-10 NOTE — Telephone Encounter (Signed)
Sounds like he may be flaring despite higher dose of Lialda. I would check C Diff PCR to ensure negative, but can go ahead and start prednisone 11m / day in the interim. I would continue that for 2 weeks and then taper by 551m/ day until done. He should have follow up scheduled with Dr. GeCarlean Purlo assess long term plan given flare on high dose Lialda. Thanks

## 2019-12-10 NOTE — Telephone Encounter (Signed)
Please advise Sir, thank you. 

## 2019-12-11 ENCOUNTER — Other Ambulatory Visit: Payer: Commercial Managed Care - PPO

## 2019-12-11 DIAGNOSIS — R197 Diarrhea, unspecified: Secondary | ICD-10-CM

## 2019-12-11 DIAGNOSIS — K5100 Ulcerative (chronic) pancolitis without complications: Secondary | ICD-10-CM

## 2019-12-13 LAB — CLOSTRIDIUM DIFFICILE TOXIN B, QUALITATIVE, REAL-TIME PCR: Toxigenic C. Difficile by PCR: NOT DETECTED

## 2020-01-11 ENCOUNTER — Encounter: Payer: Self-pay | Admitting: Internal Medicine

## 2020-01-11 ENCOUNTER — Ambulatory Visit: Payer: Commercial Managed Care - PPO | Admitting: Internal Medicine

## 2020-01-11 VITALS — BP 138/68 | HR 105 | Ht 70.0 in | Wt 241.0 lb

## 2020-01-11 DIAGNOSIS — K5100 Ulcerative (chronic) pancolitis without complications: Secondary | ICD-10-CM | POA: Diagnosis not present

## 2020-01-11 NOTE — Assessment & Plan Note (Signed)
He has not been in control this year.  We talked about this and how he might need a biologic agent.  2 flares requiring steroids.  He indicates to me that he was not taking his sulfasalazine as prescribed in the past and he would like to try that again as he felt like that a controlled things better than Lialda did after the switch.  I think that is reasonable though it may not hold.  The plan is for him to finish his prednisone taper and to take Azulfidine (sulfasalazine) 2 g twice daily.  He will try to have better adherence to the medication regimen.  I want him to come for a CBC and a fecal calprotectin in early February as a form of reassessment and to check his blood count on sulfasalazine.  Further plans pending that.

## 2020-01-11 NOTE — Patient Instructions (Signed)
Stop your lialda.  Take the sulfadiazine 523m, 4 pills twice daily.  Please come back the first week of February to have blood drawn.    The lab is open 7:30AM-5:30PM Monday-Friday. No appointment is needed.  Due to recent changes in healthcare laws, you may see the results of your imaging and laboratory studies on MyChart before your provider has had a chance to review them.  We understand that in some cases there may be results that are confusing or concerning to you. Not all laboratory results come back in the same time frame and the provider may be waiting for multiple results in order to interpret others.  Please give [id number] hours in order for your provider to thoroughly review all the results before contacting the office for clarification of your results.   I appreciate the opportunity to care for you. CSilvano Rusk MD, [facility]

## 2020-01-11 NOTE — Progress Notes (Signed)
Isaiah Miller 64 y.o. 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  We talked about this and how he might need a biologic agent.  2 flares requiring steroids.  He indicates to me that he was not taking his sulfasalazine as prescribed in the past and he would like to try that again as he felt like that a controlled things better than Lialda did after the switch.  I think that is reasonable though it may not hold.  The plan is for him to finish his prednisone taper and to take Azulfidine (sulfasalazine) 2 g twice daily.  He will try to have better adherence to the medication regimen.  I want him to come for a CBC and a fecal calprotectin in early February as a form of reassessment and to check his blood count on sulfasalazine.  Further plans pending that.   CC: Libby Maw, MD  Subjective:   Chief Complaint: Follow-up of ulcerative colitis  HPI 64 year old white man with longstanding universal ulcerative colitis who recently developed a flare.  He had a colonoscopy in March after a flare in which she had been using some NSAIDs.  I had put him on Celebrex after that.  He used steroids to control that flare.  Colonoscopy findings as below:  - One diminutive polyp in the rectum, removed with a cold snare. Resected and retrieved. - Erythematous, granular, inflamed and ulcerated mucosa in the rectum and in the sigmoid colon. Biopsied. Biopsies of entire colon 2 every 10 cm - cecum/ascending; transverse; descending/prox sigmoid; distal sigmoid/rectum - The examination was otherwise normal on direct and retroflexion views.   Pathology as below Diagnosis 1. Surgical [P], colon, ascending and cecum - CHRONIC COLITIS - NO ACUTE INFLAMMATION, DYSPLASIA OR MALIGNANCY IDENTIFIED 2. Surgical [P], colon, transverse - CHRONIC COLITIS - NO ACUTE INFLAMMATION, DYSPLASIA OR MALIGNANCY IDENTIFIED 3.  Surgical [P], colon, descending and proximal sigmoid - CHRONIC COLITIS - NO ACUTE INFLAMMATION, DYSPLASIA OR MALIGNANCY IDENTIFIED 4. Surgical [P], colon, distal sigmoid and rectum - CHRONIC COLITIS - NO ACUTE INFLAMMATION, DYSPLASIA OR MALIGNANCY IDENTIFIED 5. Surgical [P], colon, rectum, polyp - HYPERPLASTIC POLYP (2 OF 2 FRAGMENTS) - NO HIGH GRADE DYSPLASIA OR MALIGNANCY IDENTIFIED DAWN BUTLER MD Pathologist, Electronic Signature  At that point I started him on Lialda 2.4 g twice daily as I thought he was failing sulfasalazine.  Today he tells me that he probably was not taking his full dose of sulfasalazine prior to that.  Since starting the Humira he felt like at some point he was starting to have a little bit of diarrhea so we switch back to his sulfasalazine and then is sort of been alternating that with Lialda some.  He developed more severe symptoms with diarrhea and we called in a prednisone taper. C. difficile testing was negative  He feels well at this point No Known Allergies Current Meds  Medication Sig  . carbamide peroxide (DEBROX) 6.5 % OTIC solution Place 5 drops into both ears 2 (two) times daily.  . celecoxib (CELEBREX) 200 MG capsule TAKE 1 CAPSULE TWICE A DAY AS NEEDED FOR MODERATE PAIN  . eszopiclone (LUNESTA) 1 MG TABS tablet TAKE 1 TABLET BY MOUTH EVERY DAY AT BEDTIME AS NEEDED FOR SLEEP  . mesalamine (LIALDA) 1.2 g EC tablet Take 2 tablets (2.4 g total) by mouth 2 (two) times daily.  . predniSONE (DELTASONE) 10 MG tablet Take 4 tablets (  40 mg total) by mouth daily with breakfast for 14 days, THEN 3.5 tablets (35 mg total) daily with breakfast for 7 days, THEN 3 tablets (30 mg total) daily with breakfast for 7 days, THEN 2.5 tablets (25 mg total) daily with breakfast for 7 days, THEN 2 tablets (20 mg total) daily with breakfast for 7 days, THEN 1.5 tablets (15 mg total) daily with breakfast for 7 days, THEN 1 tablet (10 mg total) daily with breakfast for 7 days, THEN  0.5 tablets (5 mg total) daily with breakfast for 7 days.  . Vitamin D, Ergocalciferol, (DRISDOL) 1.25 MG (50000 UNIT) CAPS capsule Take 1 capsule (50,000 Units total) by mouth every 7 (seven) days.   Past Medical History:  Diagnosis Date  . GERD (gastroesophageal reflux disease)   . Hepatitis    ? EtOH  . Ulcerative colitis    dx 1987 left and then more universal  . Vitamin D deficiency    Past Surgical History:  Procedure Laterality Date  . CHOLECYSTECTOMY    . COLONOSCOPY W/ BIOPSIES  05/2006, 03/2009   ulcerative colitis left and right, no dysplasia  . KNEE SURGERY Right    Social History   Social History Narrative   Occasional caffeine    family history includes Diabetes in his mother.   Review of Systems As above  Objective:   Physical Exam BP 138/68   Pulse (!) 105   Ht 5' 10"  (1.778 m)   Wt 241 lb (109.3 kg)   BMI 34.58 kg/m

## 2020-03-11 ENCOUNTER — Telehealth: Payer: Self-pay

## 2020-03-11 MED ORDER — CELECOXIB 200 MG PO CAPS: ORAL_CAPSULE | ORAL | 3 refills | Status: DC

## 2020-03-11 NOTE — Telephone Encounter (Signed)
1year

## 2020-03-11 NOTE — Telephone Encounter (Signed)
Celecoxib capsules refilled as approved.

## 2020-03-11 NOTE — Telephone Encounter (Signed)
How many refills may I put on Mr Coufal Celecoxib Sir? Pharmacy sent a request, patient seen December 2021.

## 2020-03-13 ENCOUNTER — Other Ambulatory Visit (INDEPENDENT_AMBULATORY_CARE_PROVIDER_SITE_OTHER): Payer: Commercial Managed Care - PPO

## 2020-03-13 DIAGNOSIS — K5100 Ulcerative (chronic) pancolitis without complications: Secondary | ICD-10-CM

## 2020-03-13 LAB — CBC WITH DIFFERENTIAL/PLATELET
Basophils Absolute: 0 10*3/uL (ref 0.0–0.1)
Basophils Relative: 0.4 % (ref 0.0–3.0)
Eosinophils Absolute: 0.1 10*3/uL (ref 0.0–0.7)
Eosinophils Relative: 1.5 % (ref 0.0–5.0)
HCT: 42.6 % (ref 39.0–52.0)
Hemoglobin: 14.2 g/dL (ref 13.0–17.0)
Lymphocytes Relative: 32.6 % (ref 12.0–46.0)
Lymphs Abs: 2.7 10*3/uL (ref 0.7–4.0)
MCHC: 33.4 g/dL (ref 30.0–36.0)
MCV: 100 fl (ref 78.0–100.0)
Monocytes Absolute: 0.8 10*3/uL (ref 0.1–1.0)
Monocytes Relative: 9.4 % (ref 3.0–12.0)
Neutro Abs: 4.7 10*3/uL (ref 1.4–7.7)
Neutrophils Relative %: 56.1 % (ref 43.0–77.0)
Platelets: 231 10*3/uL (ref 150.0–400.0)
RBC: 4.26 Mil/uL (ref 4.22–5.81)
RDW: 13.5 % (ref 11.5–15.5)
WBC: 8.4 10*3/uL (ref 4.0–10.5)

## 2020-03-17 NOTE — Telephone Encounter (Signed)
I did a prior authorization for patient's celebrex 270m capsules thru Covermymeds. This has been approved for patient's Ulcerative Colitis K51.00. Other pain rx's could cause him to flare per Dr GCarlean Purl Good thru 03/17/2021.

## 2020-03-17 NOTE — Telephone Encounter (Signed)
Amy from [facility] is requesting a call back from a nurse, caller states the pt's insurance plan does not cover the  CELEBREX. Caller would like to review some other medications that would be covered.  CB [phone] Ref#: [id number]

## 2020-07-29 ENCOUNTER — Other Ambulatory Visit: Payer: Self-pay

## 2020-07-29 MED ORDER — SULFASALAZINE 500 MG PO TABS: 2000.0000 mg | ORAL_TABLET | Freq: Two times a day (BID) | ORAL | 3 refills | Status: DC

## 2020-07-29 NOTE — Telephone Encounter (Signed)
Patient informed that sulfasalazine refilled as requested.

## 2020-08-08 ENCOUNTER — Telehealth: Payer: Self-pay | Admitting: *Deleted

## 2020-08-08 MED ORDER — SULFASALAZINE 500 MG PO TABS: 2000.0000 mg | ORAL_TABLET | Freq: Two times a day (BID) | ORAL | 3 refills | Status: DC

## 2020-08-08 NOTE — Telephone Encounter (Signed)
90 day supply [facility]

## 2020-09-15 ENCOUNTER — Encounter: Payer: Self-pay | Admitting: Internal Medicine

## 2020-09-22 ENCOUNTER — Ambulatory Visit (INDEPENDENT_AMBULATORY_CARE_PROVIDER_SITE_OTHER): Payer: Commercial Managed Care - PPO

## 2020-09-22 ENCOUNTER — Encounter: Payer: Self-pay | Admitting: Family Medicine

## 2020-09-22 ENCOUNTER — Other Ambulatory Visit: Payer: Self-pay

## 2020-09-22 ENCOUNTER — Ambulatory Visit (INDEPENDENT_AMBULATORY_CARE_PROVIDER_SITE_OTHER): Payer: Commercial Managed Care - PPO | Admitting: Family Medicine

## 2020-09-22 VITALS — BP 132/78 | HR 84 | Temp 97.7°F | Ht 70.0 in | Wt 241.2 lb

## 2020-09-22 DIAGNOSIS — M25472 Effusion, left ankle: Secondary | ICD-10-CM

## 2020-09-22 DIAGNOSIS — Z0000 Encounter for general adult medical examination without abnormal findings: Secondary | ICD-10-CM | POA: Diagnosis not present

## 2020-09-22 DIAGNOSIS — E559 Vitamin D deficiency, unspecified: Secondary | ICD-10-CM

## 2020-09-22 DIAGNOSIS — R311 Benign essential microscopic hematuria: Secondary | ICD-10-CM

## 2020-09-22 DIAGNOSIS — R7309 Other abnormal glucose: Secondary | ICD-10-CM | POA: Diagnosis not present

## 2020-09-22 LAB — VITAMIN D 25 HYDROXY (VIT D DEFICIENCY, FRACTURES): VITD: 20.65 ng/mL — ABNORMAL LOW (ref 30.00–100.00)

## 2020-09-22 LAB — URINALYSIS, ROUTINE W REFLEX MICROSCOPIC
Ketones, ur: NEGATIVE
Leukocytes,Ua: NEGATIVE
Nitrite: NEGATIVE
Specific Gravity, Urine: >=1.03 — AB (ref 1.000–1.030)
Urine Glucose: NEGATIVE
Urobilinogen, UA: 0.2 (ref 0.0–1.0)
pH: 6 (ref 5.0–8.0)

## 2020-09-22 LAB — PSA: PSA: 2.49 ng/mL (ref 0.10–4.00)

## 2020-09-22 LAB — HEMOGLOBIN A1C: Hgb A1c MFr Bld: 4.6 % (ref 4.6–6.5)

## 2020-09-22 LAB — COMPREHENSIVE METABOLIC PANEL
ALT: 18 U/L (ref 0–53)
AST: 18 U/L (ref 0–37)
Albumin: 4.4 g/dL (ref 3.5–5.2)
Alkaline Phosphatase: 68 U/L (ref 39–117)
BUN: 14 mg/dL (ref 6–23)
CO2: 22 mEq/L (ref 19–32)
Calcium: 9.5 mg/dL (ref 8.4–10.5)
Chloride: 108 mEq/L (ref 96–112)
Creatinine, Ser: 0.85 mg/dL (ref 0.40–1.50)
GFR: 92.08 mL/min (ref >60.00)
Glucose, Bld: 100 mg/dL — ABNORMAL HIGH (ref 70–99)
Potassium: 4.5 mEq/L (ref 3.5–5.1)
Sodium: 139 mEq/L (ref 135–145)
Total Bilirubin: 0.5 mg/dL (ref 0.2–1.2)
Total Protein: 6.6 g/dL (ref 6.0–8.3)

## 2020-09-22 LAB — CBC
HCT: 42 % (ref 39.0–52.0)
Hemoglobin: 13.8 g/dL (ref 13.0–17.0)
MCHC: 32.8 g/dL (ref 30.0–36.0)
MCV: 101.1 fl — ABNORMAL HIGH (ref 78.0–100.0)
Platelets: 227 10*3/uL (ref 150.0–400.0)
RBC: 4.16 Mil/uL — ABNORMAL LOW (ref 4.22–5.81)
RDW: 13.5 % (ref 11.5–15.5)
WBC: 6.9 10*3/uL (ref 4.0–10.5)

## 2020-09-22 LAB — LIPID PANEL
Cholesterol: 213 mg/dL — ABNORMAL HIGH (ref 0–200)
HDL: 43.6 mg/dL (ref >39.00)
LDL Cholesterol: 155 mg/dL — ABNORMAL HIGH (ref 0–99)
NonHDL: 169.4
Total CHOL/HDL Ratio: 5
Triglycerides: 73 mg/dL (ref 0.0–149.0)
VLDL: 14.6 mg/dL (ref 0.0–40.0)

## 2020-09-22 LAB — URIC ACID: Uric Acid, Serum: 5.9 mg/dL (ref 4.0–7.8)

## 2020-09-22 NOTE — Progress Notes (Addendum)
Established Patient Office Visit  Subjective:  Patient ID: Isaiah Miller, male    DOB: [birth year]  Age: 65 y.o. MRN: [id number]  CC:  Chief Complaint  Patient presents with   Annual Exam    CPE, no concerns. Patient fasting for labs.     HPI Isaiah Miller presents for his yearly physical.  He is doing okay.  Sleep is improved by avoiding carbohydrate close to bedtime.  He is no longer taking the Lunesta because it gave him bad dreams.  He does use marijuana at night and it helps with sleep.  Having some swelling around his great toes.  Denies frank pain or erythema.  Colonoscopy this past March a year ago.  Urine flow is okay.  His wife just had her second mastectomy this week.  Of course he is a little stressed about that.  Past Medical History:  Diagnosis Date   GERD (gastroesophageal reflux disease)    Hepatitis    ? EtOH   Ulcerative colitis    dx 1987 left and then more universal   Vitamin D deficiency     Past Surgical History:  Procedure Laterality Date   CHOLECYSTECTOMY     COLONOSCOPY W/ BIOPSIES  05/2006, 03/2009   ulcerative colitis left and right, no dysplasia   KNEE SURGERY Right     Family History  Problem Relation Age of Onset   Diabetes Mother    Colon cancer Neg Hx    Rectal cancer Neg Hx    Stomach cancer Neg Hx    Esophageal cancer Neg Hx    Pancreatic cancer Neg Hx    Colon polyps Neg Hx    Heart disease Neg Hx    Kidney disease Neg Hx    Liver disease Neg Hx     Social History   Socioeconomic History   Marital status: Married    Spouse name: Not on file   Number of children: 3   Years of education: Not on file   Highest education level: Not on file  Occupational History   Occupation: Company secretary: [employer]  Tobacco Use   Smoking status: Never   Smokeless tobacco: Never  Vaping Use   Vaping Use: Never used  Substance and Sexual Activity   Alcohol use: Yes    Alcohol/week: 4.0 - 6.0 standard drinks     Types: 4 - 6 Standard drinks or equivalent per week    Comment: occasional    Drug use: Yes    Types: Marijuana    Comment: some nights to help with sleep   Sexual activity: Yes    Comment: married  Other Topics Concern   Not on file  Social History Narrative   Occasional caffeine    Social Determinants of Health   Financial Resource Strain: Not on file  Food Insecurity: Not on file  Transportation Needs: Not on file  Physical Activity: Not on file  Stress: Not on file  Social Connections: Not on file  Intimate Partner Violence: Not on file    Outpatient Medications Prior to Visit  Medication Sig Dispense Refill   carbamide peroxide (DEBROX) 6.5 % OTIC solution Place 5 drops into both ears 2 (two) times daily. 15 mL 4   celecoxib (CELEBREX) 200 MG capsule TAKE 1 CAPSULE TWICE A DAY AS NEEDED FOR MODERATE PAIN 180 capsule 3   sulfaSALAzine (AZULFIDINE) 500 MG tablet Take 4 tablets (2,000 mg total) by mouth 2 (two) times daily.  720 tablet 3   eszopiclone (LUNESTA) 1 MG TABS tablet TAKE 1 TABLET BY MOUTH EVERY DAY AT BEDTIME AS NEEDED FOR SLEEP (Patient not taking: Reported on 09/22/2020) 30 tablet 1   Vitamin D, Ergocalciferol, (DRISDOL) 1.25 MG (50000 UNIT) CAPS capsule Take 1 capsule (50,000 Units total) by mouth every 7 (seven) days. (Patient not taking: Reported on 09/22/2020) 5 capsule 5   No facility-administered medications prior to visit.    No Known Allergies  ROS Review of Systems  Constitutional: Negative.   HENT: Negative.    Eyes:  Negative for photophobia and visual disturbance.  Respiratory: Negative.    Cardiovascular: Negative.   Gastrointestinal: Negative.   Endocrine: Negative for polyphagia and polyuria.  Genitourinary:  Negative for difficulty urinating, frequency and urgency.  Skin:  Negative for pallor and rash.  Neurological:  Negative for speech difficulty and weakness.  Psychiatric/Behavioral: Negative.       Depression screen [facility] 2/9  09/22/2020 09/22/2020 08/31/2019  Decreased Interest 1 0 1  Down, Depressed, Hopeless 1 0 0  PHQ - 2 Score 2 0 1  Altered sleeping 1 - 1  Tired, decreased energy 1 - 0  Change in appetite 0 - 1  Feeling bad or failure about yourself  0 - 0  Trouble concentrating 1 - 0  Moving slowly or fidgety/restless 0 - 0  Suicidal thoughts 0 - 0  PHQ-9 Score 5 - 3  Difficult doing work/chores Not difficult at all - Somewhat difficult     Objective:    Physical Exam Vitals and nursing note reviewed.  Constitutional:      General: He is not in acute distress.    Appearance: Normal appearance. He is not ill-appearing, toxic-appearing or diaphoretic.  HENT:     Head: Normocephalic and atraumatic.     Right Ear: Tympanic membrane, ear canal and external ear normal.     Left Ear: Tympanic membrane, ear canal and external ear normal.     Mouth/Throat:     Mouth: Mucous membranes are moist.     Pharynx: Oropharynx is clear. No oropharyngeal exudate or posterior oropharyngeal erythema.  Eyes:     General: No scleral icterus.       Right eye: No discharge.        Left eye: No discharge.     Extraocular Movements: Extraocular movements intact.     Conjunctiva/sclera: Conjunctivae normal.     Pupils: Pupils are equal, round, and reactive to light.  Neck:     Vascular: No carotid bruit.  Cardiovascular:     Rate and Rhythm: Normal rate and regular rhythm.  Pulmonary:     Effort: Pulmonary effort is normal.     Breath sounds: Normal breath sounds.  Abdominal:     General: Abdomen is flat. Bowel sounds are normal. There is no distension.     Palpations: There is no mass.     Tenderness: There is no abdominal tenderness. There is no guarding or rebound.     Hernia: A hernia is present.  Genitourinary:    Prostate: Enlarged. Not tender and no nodules present.     Rectum: Guaiac result negative. No mass, tenderness, anal fissure, external hemorrhoid or internal hemorrhoid. Normal anal tone.   Musculoskeletal:        General: Deformity (left medial malleolus) present.     Cervical back: No rigidity or tenderness.     Right lower leg: No edema.     Left lower leg: No edema.  Lymphadenopathy:     Cervical: No cervical adenopathy.  Skin:    General: Skin is warm and dry.  Neurological:     Mental Status: He is alert and oriented to person, place, and time.  Psychiatric:        Mood and Affect: Mood normal.        Behavior: Behavior normal.    BP 132/78   Pulse 84   Temp 97.7 F (36.5 C) (Temporal)   Ht 5' 10"  (1.778 m)   Wt 241 lb 3.2 oz (109.4 kg)   SpO2 94%   BMI 34.61 kg/m  Wt Readings from Last 3 Encounters:  09/22/20 241 lb 3.2 oz (109.4 kg)  01/11/20 241 lb (109.3 kg)  08/31/19 (!) 251 lb 3.2 oz (113.9 kg)     Health Maintenance Due  Topic Date Due   HIV Screening  Never done   Hepatitis C Screening  Never done   Zoster Vaccines- Shingrix (1 of 2) Never done   COLONOSCOPY (Pts 45-29yr Insurance coverage will need to be confirmed)  05/03/2020   INFLUENZA VACCINE  09/08/2020    There are no preventive care reminders to display for this patient.  Lab Results  Component Value Date   TSH 2.07 08/31/2019   Lab Results  Component Value Date   WBC 6.9 09/22/2020   HGB 13.8 09/22/2020   HCT 42.0 09/22/2020   MCV 101.1 (H) 09/22/2020   PLT 227.0 09/22/2020   Lab Results  Component Value Date   NA 139 09/22/2020   K 4.5 09/22/2020   CO2 22 09/22/2020   GLUCOSE 100 (H) 09/22/2020   BUN 14 09/22/2020   CREATININE 0.85 09/22/2020   BILITOT 0.5 09/22/2020   ALKPHOS 68 09/22/2020   AST 18 09/22/2020   ALT 18 09/22/2020   PROT 6.6 09/22/2020   ALBUMIN 4.4 09/22/2020   CALCIUM 9.5 09/22/2020   ANIONGAP 14 11/30/2013   GFR 92.08 09/22/2020   Lab Results  Component Value Date   CHOL 213 (H) 09/22/2020   Lab Results  Component Value Date   HDL 43.60 09/22/2020   Lab Results  Component Value Date   LDLCALC 155 (H) 09/22/2020   Lab  Results  Component Value Date   TRIG 73.0 09/22/2020   Lab Results  Component Value Date   CHOLHDL 5 09/22/2020   Lab Results  Component Value Date   HGBA1C 4.6 09/22/2020      Assessment & Plan:   Problem List Items Addressed This Visit       Other   Vitamin D deficiency - Primary   Relevant Medications   Vitamin D, Ergocalciferol, (DRISDOL) 1.25 MG (50000 UNIT) CAPS capsule   Other Relevant Orders   VITAMIN D 25 Hydroxy (Vit-D Deficiency, Fractures) (Completed)   Morbid obesity ([place]   Well adult exam   Elevated glucose   Relevant Orders   Hemoglobin A1c (Completed)   Left ankle swelling   Relevant Orders   Uric acid (Completed)   DG Ankle Complete Left (Completed)   Other Visit Diagnoses     Healthcare maintenance       Relevant Orders   CBC (Completed)   Comprehensive metabolic panel (Completed)   Lipid panel (Completed)   Urinalysis, Routine w reflex microscopic (Completed)   PSA (Completed)   Benign essential microscopic hematuria       Relevant Orders   Urinalysis, Routine w reflex microscopic       Meds ordered this encounter  Medications  Vitamin D, Ergocalciferol, (DRISDOL) 1.25 MG (50000 UNIT) CAPS capsule    Sig: Take 1 capsule (50,000 Units total) by mouth every 7 (seven) days.    Dispense:  12 capsule    Refill:  2     Follow-up: Return in about 6 months (around 03/25/2021), or Please have shingrix vaccine..   Encourage ongoing weight loss.  Checking ankle x-ray for deformity in the medial malleolus.  Given information on health maintenance and disease prevention.  He will use compression stockings for work to help with the swelling in his feet.  Checking uric acid regarding the swelling in his great toes.  Good luck to his wife with her recent mastectomy.  He says that he is stress and sadness are secondary to his Kelle Darting recent health issues.  He is not interested in treatment at this time. Libby Maw, MD

## 2020-09-23 MED ORDER — VITAMIN D (ERGOCALCIFEROL) 1.25 MG (50000 UNIT) PO CAPS: 50000.0000 [IU] | ORAL_CAPSULE | ORAL | 2 refills | Status: DC

## 2020-09-23 NOTE — Progress Notes (Signed)
1. Vit D is low. Please take high dose Vit D that has been prescribed.   2. Ldl or bad cholesterol is elevated. Please lower fat and cholesterol in diet. Your risk for having a vascular event is mildly elevated.   The 10-year ASCVD risk score Isaiah Miller DC Brooke Bonito., et al., 2013) is: 14.1%   Values used to calculate the score:     Age: 65 years     Sex: Male     Is Non-Hispanic African American: No     Diabetic: No     Tobacco smoker: No     Systolic Blood Pressure: 284 mmHg     Is BP treated: No     HDL Cholesterol: 43.6 mg/dL     Total Cholesterol: 213 mg/dL  3. There is a small amount of blood in your urine. Please hydrate well and return to give [place] another sample.

## 2020-09-23 NOTE — Addendum Note (Signed)
Addended by: Jon Billings on: 09/23/2020 07:49 AM   Modules accepted: Orders

## 2021-03-23 ENCOUNTER — Encounter: Payer: Self-pay | Admitting: Family Medicine

## 2021-09-24 ENCOUNTER — Ambulatory Visit (INDEPENDENT_AMBULATORY_CARE_PROVIDER_SITE_OTHER): Payer: Commercial Managed Care - PPO | Admitting: Family Medicine

## 2021-09-24 ENCOUNTER — Encounter: Payer: Self-pay | Admitting: Family Medicine

## 2021-09-24 VITALS — BP 132/72 | HR 79 | Temp 98.0°F | Ht 70.0 in | Wt 244.4 lb

## 2021-09-24 DIAGNOSIS — R0683 Snoring: Secondary | ICD-10-CM

## 2021-09-24 DIAGNOSIS — R7309 Other abnormal glucose: Secondary | ICD-10-CM

## 2021-09-24 DIAGNOSIS — E7800 Pure hypercholesterolemia, unspecified: Secondary | ICD-10-CM

## 2021-09-24 DIAGNOSIS — E538 Deficiency of other specified B group vitamins: Secondary | ICD-10-CM

## 2021-09-24 DIAGNOSIS — E559 Vitamin D deficiency, unspecified: Secondary | ICD-10-CM

## 2021-09-24 DIAGNOSIS — Z0000 Encounter for general adult medical examination without abnormal findings: Secondary | ICD-10-CM

## 2021-09-24 DIAGNOSIS — R202 Paresthesia of skin: Secondary | ICD-10-CM | POA: Diagnosis not present

## 2021-09-24 LAB — CBC WITH DIFFERENTIAL/PLATELET
Basophils Absolute: 0.1 10*3/uL (ref 0.0–0.1)
Basophils Relative: 0.8 % (ref 0.0–3.0)
Eosinophils Absolute: 0.1 10*3/uL (ref 0.0–0.7)
Eosinophils Relative: 1.7 % (ref 0.0–5.0)
HCT: 44.4 % (ref 39.0–52.0)
Hemoglobin: 14.6 g/dL (ref 13.0–17.0)
Lymphocytes Relative: 31.8 % (ref 12.0–46.0)
Lymphs Abs: 2.3 10*3/uL (ref 0.7–4.0)
MCHC: 32.8 g/dL (ref 30.0–36.0)
MCV: 102.1 fl — ABNORMAL HIGH (ref 78.0–100.0)
Monocytes Absolute: 0.8 10*3/uL (ref 0.1–1.0)
Monocytes Relative: 10.8 % (ref 3.0–12.0)
Neutro Abs: 3.9 10*3/uL (ref 1.4–7.7)
Neutrophils Relative %: 54.9 % (ref 43.0–77.0)
Platelets: 230 10*3/uL (ref 150.0–400.0)
RBC: 4.35 Mil/uL (ref 4.22–5.81)
RDW: 14.2 % (ref 11.5–15.5)
WBC: 7.1 10*3/uL (ref 4.0–10.5)

## 2021-09-24 LAB — VITAMIN D 25 HYDROXY (VIT D DEFICIENCY, FRACTURES): VITD: 12.65 ng/mL — ABNORMAL LOW (ref 30.00–100.00)

## 2021-09-24 LAB — VITAMIN B12: Vitamin B-12: 337 pg/mL (ref 211–911)

## 2021-09-24 LAB — COMPREHENSIVE METABOLIC PANEL
ALT: 25 U/L (ref 0–53)
AST: 22 U/L (ref 0–37)
Albumin: 4.7 g/dL (ref 3.5–5.2)
Alkaline Phosphatase: 75 U/L (ref 39–117)
BUN: 16 mg/dL (ref 6–23)
CO2: 27 mEq/L (ref 19–32)
Calcium: 9.4 mg/dL (ref 8.4–10.5)
Chloride: 104 mEq/L (ref 96–112)
Creatinine, Ser: 0.92 mg/dL (ref 0.40–1.50)
GFR: 87.53 mL/min (ref >60.00)
Glucose, Bld: 98 mg/dL (ref 70–99)
Potassium: 4.6 mEq/L (ref 3.5–5.1)
Sodium: 138 mEq/L (ref 135–145)
Total Bilirubin: 0.5 mg/dL (ref 0.2–1.2)
Total Protein: 7.1 g/dL (ref 6.0–8.3)

## 2021-09-24 LAB — URINALYSIS, ROUTINE W REFLEX MICROSCOPIC
Bilirubin Urine: NEGATIVE
Ketones, ur: NEGATIVE
Leukocytes,Ua: NEGATIVE
Nitrite: NEGATIVE
Specific Gravity, Urine: 1.02 (ref 1.000–1.030)
Total Protein, Urine: NEGATIVE
Urine Glucose: NEGATIVE
Urobilinogen, UA: 0.2 (ref 0.0–1.0)
pH: 5.5 (ref 5.0–8.0)

## 2021-09-24 LAB — LIPID PANEL
Cholesterol: 250 mg/dL — ABNORMAL HIGH (ref 0–200)
HDL: 46.5 mg/dL (ref >39.00)
LDL Cholesterol: 185 mg/dL — ABNORMAL HIGH (ref 0–99)
NonHDL: 203.98
Total CHOL/HDL Ratio: 5
Triglycerides: 93 mg/dL (ref 0.0–149.0)
VLDL: 18.6 mg/dL (ref 0.0–40.0)

## 2021-09-24 LAB — PSA: PSA: 2.54 ng/mL (ref 0.10–4.00)

## 2021-09-24 LAB — HEMOGLOBIN A1C: Hgb A1c MFr Bld: 4.5 % — ABNORMAL LOW (ref 4.6–6.5)

## 2021-09-24 MED ORDER — VITAMIN B-12 1000 MCG PO TABS: 1000.0000 ug | ORAL_TABLET | Freq: Every day | ORAL | 2 refills | Status: DC

## 2021-09-24 MED ORDER — ATORVASTATIN CALCIUM 20 MG PO TABS: 20.0000 mg | ORAL_TABLET | Freq: Every day | ORAL | 3 refills | Status: DC

## 2021-09-24 MED ORDER — VITAMIN D (ERGOCALCIFEROL) 1.25 MG (50000 UNIT) PO CAPS: 50000.0000 [IU] | ORAL_CAPSULE | ORAL | 2 refills | Status: DC

## 2021-09-24 MED ORDER — GABAPENTIN 600 MG PO TABS: 600.0000 mg | ORAL_TABLET | Freq: Every day | ORAL | 1 refills | Status: DC

## 2021-09-24 NOTE — Progress Notes (Addendum)
Established Patient Office Visit  Subjective   Patient ID: Isaiah Miller, male    DOB: August 26, 1955  Age: 66 y.o. MRN: [id number]  Chief Complaint  Patient presents with   Annual Exam    CPE, patient would Rx for gabapentin to help with numbness and tingling in feet. Patient fasting for labs.     HPI for complete physical and multiple health issues and concerns.  Talks about paresthesias in his feet.  Left is affected greater than the right.  Sometimes feels as though the paresthesias in his right foot are coming from the leg.  Denies buttock or back pain.  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  He has regular dental care.  No regular exercise.  History of elevated LDL cholesterol with elevated ASCVD risk score.  History of vitamin D deficiency without supplementation at this point.  Denies saddle paresthesias or changes in his bowel or bladder function.    Review of Systems  Constitutional: Negative.   HENT: Negative.    Eyes:  Negative for blurred vision, discharge and redness.  Respiratory: Negative.    Cardiovascular: Negative.   Gastrointestinal:  Negative for abdominal pain, blood in stool, constipation, diarrhea and melena.  Genitourinary: Negative.  Negative for dysuria, frequency, hematuria and urgency.  Musculoskeletal: Negative.  Negative for back pain and myalgias.  Skin:  Negative for rash.  Neurological:  Negative for tingling, loss of consciousness and weakness.  Endo/Heme/Allergies:  Negative for polydipsia.      09/24/2021    8:34 AM 09/24/2021    8:23 AM 09/22/2020   10:28 AM  Depression screen PHQ 2/9  Decreased Interest 0 0 1  Down, Depressed, Hopeless 0 0 1  PHQ - 2 Score 0 0 2  Altered sleeping 1  1  Tired, decreased energy 1  1  Change in appetite 0  0  Feeling bad or failure about yourself  0  0  Trouble concentrating 0  1  Moving slowly or fidgety/restless 0  0  Suicidal thoughts 0  0  PHQ-9 Score 2  5  Difficult doing work/chores Not difficult at all   Not difficult at all        Objective:     BP 132/72 (BP Location: Right Arm, Patient Position: Sitting, Cuff Size: Large)   Pulse 79   Temp 98 F (36.7 C) (Temporal)   Ht 5' 10"  (1.778 m)   Wt 244 lb 6.4 oz (110.9 kg)   SpO2 95%   BMI 35.07 kg/m  Wt Readings from Last 3 Encounters:  09/24/21 244 lb 6.4 oz (110.9 kg)  09/22/20 241 lb 3.2 oz (109.4 kg)  01/11/20 241 lb (109.3 kg)      Physical Exam Constitutional:      General: He is not in acute distress.    Appearance: Normal appearance. He is not ill-appearing, toxic-appearing or diaphoretic.  HENT:     Head: Normocephalic and atraumatic.     Right Ear: External ear normal.     Left Ear: External ear normal.     Mouth/Throat:     Mouth: Mucous membranes are moist.     Pharynx: Oropharynx is clear. No oropharyngeal exudate or posterior oropharyngeal erythema.  Eyes:     General: No scleral icterus.       Right eye: No discharge.        Left eye: No discharge.     Extraocular Movements: Extraocular movements intact.     Conjunctiva/sclera: Conjunctivae normal.  Pupils: Pupils are equal, round, and reactive to light.  Cardiovascular:     Rate and Rhythm: Normal rate and regular rhythm.     Pulses:          Dorsalis pedis pulses are 2+ on the right side and 2+ on the left side.       Posterior tibial pulses are 1+ on the right side and 1+ on the left side.  Pulmonary:     Effort: Pulmonary effort is normal. No respiratory distress.     Breath sounds: Normal breath sounds.  Abdominal:     General: Bowel sounds are normal.     Tenderness: There is no abdominal tenderness. There is no guarding.  Musculoskeletal:     Cervical back: No rigidity or tenderness.     Lumbar back: No bony tenderness. Normal range of motion. Negative right straight leg raise test and negative left straight leg raise test.       Back:  Skin:    General: Skin is warm and dry.  Neurological:     Mental Status: He is alert and oriented  to person, place, and time.     Motor: No weakness.  Psychiatric:        Mood and Affect: Mood normal.        Behavior: Behavior normal.      Results for orders placed or performed in visit on 09/24/21  CBC with Differential/Platelet  Result Value Ref Range   WBC 7.1 4.0 - 10.5 K/uL   RBC 4.35 4.22 - 5.81 Mil/uL   Hemoglobin 14.6 13.0 - 17.0 g/dL   HCT 44.4 39.0 - 52.0 %   MCV 102.1 (H) 78.0 - 100.0 fl   MCHC 32.8 30.0 - 36.0 g/dL   RDW 14.2 11.5 - 15.5 %   Platelets 230.0 150.0 - 400.0 K/uL   Neutrophils Relative % 54.9 43.0 - 77.0 %   Lymphocytes Relative 31.8 12.0 - 46.0 %   Monocytes Relative 10.8 3.0 - 12.0 %   Eosinophils Relative 1.7 0.0 - 5.0 %   Basophils Relative 0.8 0.0 - 3.0 %   Neutro Abs 3.9 1.4 - 7.7 K/uL   Lymphs Abs 2.3 0.7 - 4.0 K/uL   Monocytes Absolute 0.8 0.1 - 1.0 K/uL   Eosinophils Absolute 0.1 0.0 - 0.7 K/uL   Basophils Absolute 0.1 0.0 - 0.1 K/uL  Comprehensive metabolic panel  Result Value Ref Range   Sodium 138 135 - 145 mEq/L   Potassium 4.6 3.5 - 5.1 mEq/L   Chloride 104 96 - 112 mEq/L   CO2 27 19 - 32 mEq/L   Glucose, Bld 98 70 - 99 mg/dL   BUN 16 6 - 23 mg/dL   Creatinine, Ser 0.92 0.40 - 1.50 mg/dL   Total Bilirubin 0.5 0.2 - 1.2 mg/dL   Alkaline Phosphatase 75 39 - 117 U/L   AST 22 0 - 37 U/L   ALT 25 0 - 53 U/L   Total Protein 7.1 6.0 - 8.3 g/dL   Albumin 4.7 3.5 - 5.2 g/dL   GFR 87.53 >60.00 mL/min   Calcium 9.4 8.4 - 10.5 mg/dL  Hemoglobin A1c  Result Value Ref Range   Hgb A1c MFr Bld 4.5 (L) 4.6 - 6.5 %  Lipid panel  Result Value Ref Range   Cholesterol 250 (H) 0 - 200 mg/dL   Triglycerides 93.0 0.0 - 149.0 mg/dL   HDL 46.50 >39.00 mg/dL   VLDL 18.6 0.0 - 40.0 mg/dL  LDL Cholesterol 185 (H) 0 - 99 mg/dL   Total CHOL/HDL Ratio 5    NonHDL 203.98   VITAMIN D 25 Hydroxy (Vit-D Deficiency, Fractures)  Result Value Ref Range   VITD 12.65 (L) 30.00 - 100.00 ng/mL  PSA  Result Value Ref Range   PSA 2.54 0.10 - 4.00 ng/mL   Vitamin B12  Result Value Ref Range   Vitamin B-12 337 211 - 911 pg/mL  Urinalysis, Routine w reflex microscopic  Result Value Ref Range   Color, Urine YELLOW Yellow;Lt. Yellow;Straw;Dark Yellow;Amber;Green;Red;Brown   APPearance CLEAR Clear;Turbid;Slightly Cloudy;Cloudy   Specific Gravity, Urine 1.020 1.000 - 1.030   pH 5.5 5.0 - 8.0   Total Protein, Urine NEGATIVE Negative   Urine Glucose NEGATIVE Negative   Ketones, ur NEGATIVE Negative   Bilirubin Urine NEGATIVE Negative   Hgb urine dipstick SMALL (A) Negative   Urobilinogen, UA 0.2 0.0 - 1.0   Leukocytes,Ua NEGATIVE Negative   Nitrite NEGATIVE Negative   WBC, UA 0-2/hpf 0-2/hpf   RBC / HPF 0-2/hpf 0-2/hpf   Mucus, UA Presence of (A) None   Squamous Epithelial / LPF Rare(0-4/hpf) Rare(0-4/hpf)      The 10-year ASCVD risk score (Arnett DK, et al., 2019) is: 16.2%    Assessment & Plan:   Problem List Items Addressed This Visit       Other   Vitamin D deficiency - Primary   Relevant Medications   Vitamin D, Ergocalciferol, (DRISDOL) 1.25 MG (50000 UNIT) CAPS capsule   Other Relevant Orders   VITAMIN D 25 Hydroxy (Vit-D Deficiency, Fractures) (Completed)   Morbid obesity ([place])   Healthcare maintenance   Relevant Orders   CBC with Differential/Platelet (Completed)   Urinalysis   PSA (Completed)   Snores   Relevant Orders   Ambulatory referral to Sleep Studies   Elevated glucose   Relevant Orders   Hemoglobin A1c (Completed)   Paresthesias   Relevant Medications   gabapentin (NEURONTIN) 600 MG tablet   cyanocobalamin (VITAMIN B12) 1000 MCG tablet   Other Relevant Orders   CBC with Differential/Platelet (Completed)   Vitamin B12 (Completed)   Elevated LDL cholesterol level   Relevant Medications   atorvastatin (LIPITOR) 20 MG tablet   Other Relevant Orders   Comprehensive metabolic panel (Completed)   Lipid panel (Completed)   Other Visit Diagnoses     B12 deficiency       Relevant Medications    cyanocobalamin (VITAMIN B12) 1000 MCG tablet       Return in about 6 months (around 03/27/2022), or Your BMI is 35.  Please read given information and try to lose some weight..  We discussed the various causes for paresthesias in the feet including sciatica, B12 deficiency, diabetes.  We discussed alarm symptoms that need immediate follow-up.  Information was given on BMI.  Encouraged exercise and weight loss.  Information was given on sleep apnea and vitamin D deficiency.  We discussed treating his elevated LDL with his elevated ASCVD risk score.  Libby Maw, MD

## 2021-09-24 NOTE — Addendum Note (Signed)
Addended by: Jon Billings on: 09/24/2021 05:01 PM   Modules accepted: Orders

## 2021-10-09 ENCOUNTER — Telehealth: Payer: Self-pay | Admitting: Family Medicine

## 2021-10-09 NOTE — Telephone Encounter (Signed)
Caller Name: Salomon Fick w/[facility] Call back phone #: [phone]  MEDICATION(S):  gabapentin (NEURONTIN) 600 MG tablet   Days of Med Remaining: pt only received 30 days from [place]. Insurance plan requires 90 day supply on maintenance medications from mail order pharmacy.  Has the patient contacted their pharmacy (YES/NO)? Yes/faxed request 1x What did pharmacy advise?   Preferred Pharmacy:  [facility]      ~~~Please advise patient/caregiver to allow 2-3 business days to process RX refills.

## 2021-10-13 NOTE — Telephone Encounter (Signed)
Rx sent to [facility]

## 2021-10-28 ENCOUNTER — Other Ambulatory Visit: Payer: Self-pay

## 2021-10-28 MED ORDER — SULFASALAZINE 500 MG PO TABS: 2000.0000 mg | ORAL_TABLET | Freq: Two times a day (BID) | ORAL | 0 refills | Status: DC

## 2021-11-06 ENCOUNTER — Encounter: Payer: Self-pay | Admitting: Family Medicine

## 2021-11-06 DIAGNOSIS — R202 Paresthesia of skin: Secondary | ICD-10-CM

## 2021-11-06 MED ORDER — GABAPENTIN 600 MG PO TABS: 600.0000 mg | ORAL_TABLET | Freq: Two times a day (BID) | ORAL | 1 refills | Status: DC

## 2021-12-08 ENCOUNTER — Encounter: Payer: Self-pay | Admitting: Family Medicine

## 2022-01-06 ENCOUNTER — Ambulatory Visit: Payer: Commercial Managed Care - PPO | Admitting: Internal Medicine

## 2022-01-06 ENCOUNTER — Encounter: Payer: Self-pay | Admitting: Internal Medicine

## 2022-01-06 ENCOUNTER — Other Ambulatory Visit (INDEPENDENT_AMBULATORY_CARE_PROVIDER_SITE_OTHER): Payer: Commercial Managed Care - PPO

## 2022-01-06 VITALS — BP 124/80 | HR 66 | Ht 70.0 in | Wt 256.0 lb

## 2022-01-06 DIAGNOSIS — K5100 Ulcerative (chronic) pancolitis without complications: Secondary | ICD-10-CM

## 2022-01-06 LAB — CBC WITH DIFFERENTIAL/PLATELET
Basophils Absolute: 0 10*3/uL (ref 0.0–0.1)
Basophils Relative: 0.5 % (ref 0.0–3.0)
Eosinophils Absolute: 0.2 10*3/uL (ref 0.0–0.7)
Eosinophils Relative: 1.9 % (ref 0.0–5.0)
HCT: 41.9 % (ref 39.0–52.0)
Hemoglobin: 14 g/dL (ref 13.0–17.0)
Lymphocytes Relative: 27.9 % (ref 12.0–46.0)
Lymphs Abs: 2.4 10*3/uL (ref 0.7–4.0)
MCHC: 33.5 g/dL (ref 30.0–36.0)
MCV: 99.5 fl (ref 78.0–100.0)
Monocytes Absolute: 0.9 10*3/uL (ref 0.1–1.0)
Monocytes Relative: 11 % (ref 3.0–12.0)
Neutro Abs: 5 10*3/uL (ref 1.4–7.7)
Neutrophils Relative %: 58.7 % (ref 43.0–77.0)
Platelets: 223 10*3/uL (ref 150.0–400.0)
RBC: 4.21 Mil/uL — ABNORMAL LOW (ref 4.22–5.81)
RDW: 14.1 % (ref 11.5–15.5)
WBC: 8.6 10*3/uL (ref 4.0–10.5)

## 2022-01-06 MED ORDER — FOLIC ACID 1 MG PO TABS: 1.0000 mg | ORAL_TABLET | Freq: Every day | ORAL | Status: DC

## 2022-01-06 MED ORDER — SULFASALAZINE 500 MG PO TABS: 2000.0000 mg | ORAL_TABLET | Freq: Two times a day (BID) | ORAL | 3 refills | Status: DC

## 2022-01-06 NOTE — Patient Instructions (Addendum)
Your provider has requested that you go to the basement level for lab work before leaving today. Press "B" on the elevator. The lab is located at the first door on the left as you exit the elevator.   Due to recent changes in healthcare laws, you may see the results of your imaging and laboratory studies on MyChart before your provider has had a chance to review them.  We understand that in some cases there may be results that are confusing or concerning to you. Not all laboratory results come back in the same time frame and the provider may be waiting for multiple results in order to interpret others.  Please give [place] 48 hours in order for your provider to thoroughly review all the results before contacting the office for clarification of your results.   Please purchase over the counter folic acid 27m, take one daily.   We have sent the following prescriptions to your mail in pharmacy: Sulfasalazine  If you have not heard from your mail in pharmacy within 1 week or if you have not received your medication in the mail, please contact [place] [phone] we may find out why.  We will put you in the system for a colon recall for March 2024.   I appreciate the opportunity to care for you. CSilvano Rusk MD

## 2022-01-06 NOTE — Progress Notes (Unsigned)
Isaiah Miller 66 y.o. Apr 13, 1955 [id number]  Assessment & Plan:   Encounter Diagnosis  Name Primary?   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  On sulfasalazine.  In the past he had colitis (2021) uncontrolled but was unable to afford Lialda which was part of the issue.  He does need a repeat colonoscopy he is not ready to schedule we will plan on a recall for the spring and discuss again at that time.  He is considering transitioning to Medicare and reducing the amount of work and would like to wait to schedule this.  I am starting folic acid to take in conjunction with the sulfasalazine.  CBC was rechecked and is normal.  Anticipate rechecking in about 3 months.  Lab Results  Component Value Date   WBC 8.6 01/06/2022   HGB 14.0 01/06/2022   HCT 41.9 01/06/2022   MCV 99.5 01/06/2022   PLT 223.0 01/06/2022       Subjective:   Chief Complaint: Ulcerative colitis follow-up  HPI 66 year old white man with a history of universal ulcerative colitis, last colonoscopy with active colitis 05/04/2019.  Diagnosis was made in 1987 at [facility].  At that time he had a polyp removed that was not precancerous as well.  Routine follow-up with colonoscopy recommended at 1 year after starting sulfasalazine at that time.  There had been some lapse in his Lialda therapy due to cost issues.  At this point he is without diarrhea bleeding abdominal pain or problems.  He had labs earlier in the year with primary care and CBC was normal other than an MCV of 102 at that time (August).  He has not had skin rashes or other problems with the sulfasalazine.  He is considering working part-time and going on [employer] he is not yet ready to schedule a repeat colonoscopy.  He says he will he just needs some time to set it up. No Known Allergies Current Meds  Medication Sig   cyanocobalamin (VITAMIN B12) 1000 MCG tablet Take 1 tablet (1,000 mcg total) by  mouth daily.   gabapentin (NEURONTIN) 600 MG tablet Take 1 tablet (600 mg total) by mouth 2 (two) times daily.   sulfaSALAzine (AZULFIDINE) 500 MG tablet Take 4 tablets (2,000 mg total) by mouth 2 (two) times daily.   Vitamin D, Ergocalciferol, (DRISDOL) 1.25 MG (50000 UNIT) CAPS capsule Take 1 capsule (50,000 Units total) by mouth every 7 (seven) days.   Past Medical History:  Diagnosis Date   GERD (gastroesophageal reflux disease)    Hepatitis    ? EtOH   Ulcerative colitis    dx 1987 left and then more universal   Vitamin D deficiency    Past Surgical History:  Procedure Laterality Date   CHOLECYSTECTOMY     COLONOSCOPY W/ BIOPSIES  05/2006, 03/2009   ulcerative colitis left and right, no dysplasia   KNEE SURGERY Right    Social History   Social History Narrative   Occasional caffeine    family history includes Diabetes in his mother.   Review of Systems As above  Objective:   Physical Exam @BP  124/80   Pulse 66   Ht 5' 10"  (1.778 m)   Wt 256 lb (116.1 kg)   BMI 36.73 kg/m @  General:  NAD Eyes:   anicteric Lungs:  clear Heart::  S1S2 no rubs, murmurs or gallops Abdomen:  soft and nontender, BS+ Ext:   no edema, cyanosis or clubbing  Data Reviewed:  See HPI

## 2022-01-07 ENCOUNTER — Other Ambulatory Visit: Payer: Self-pay | Admitting: Internal Medicine

## 2022-01-07 DIAGNOSIS — K5100 Ulcerative (chronic) pancolitis without complications: Secondary | ICD-10-CM

## 2022-03-11 ENCOUNTER — Ambulatory Visit: Payer: Commercial Managed Care - PPO | Admitting: Sports Medicine

## 2022-03-11 ENCOUNTER — Ambulatory Visit (INDEPENDENT_AMBULATORY_CARE_PROVIDER_SITE_OTHER): Payer: Commercial Managed Care - PPO

## 2022-03-11 DIAGNOSIS — M2022 Hallux rigidus, left foot: Secondary | ICD-10-CM

## 2022-03-11 DIAGNOSIS — M25571 Pain in right ankle and joints of right foot: Secondary | ICD-10-CM

## 2022-03-11 DIAGNOSIS — R202 Paresthesia of skin: Secondary | ICD-10-CM

## 2022-03-11 DIAGNOSIS — R600 Localized edema: Secondary | ICD-10-CM | POA: Diagnosis not present

## 2022-03-11 DIAGNOSIS — M2021 Hallux rigidus, right foot: Secondary | ICD-10-CM

## 2022-03-11 DIAGNOSIS — R002 Palpitations: Secondary | ICD-10-CM

## 2022-03-11 DIAGNOSIS — G5793 Unspecified mononeuropathy of bilateral lower limbs: Secondary | ICD-10-CM

## 2022-03-11 NOTE — Progress Notes (Signed)
Isaiah Miller - 67 y.o. male MRN [id number]  Date of birth: 03/24/1955  Office Visit Note: Visit Date: 03/11/2022 PCP: Libby Maw, MD Referred by: Libby Maw,*  Subjective: No chief complaint on file.  HPI: Isaiah Miller is a pleasant 67 y.o. male who presents today for bilateral foot pain and LE swelling, neuropathy.   Has had bilateral foot and ankle pain for years now. 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 Pain worse with prolonged standing.  Also has numbness and tingling over dorsum of foot and into bilateral toes. This is most bothersome at night. Doesn't notice it as much in AM --> only after prolonged standing and at nighttime. Has taken Gabapentin '600mg'$  BID in past and this does help control symptoms. Has sprained ankles in past, wondering if this is contributing. Does drink ETOH on weekends. Is working on improving health and eating/drinking better here this year, working on weight loss.    He also has been having intermittent palpitations in his chest - happens about 2-3x per week. No chest pain associated but feels "funny feeling in chest" when it happens. He also has lower extremity edema in feet and legs - noticed for last 6 months or so. Mat-mother history of CHF on Lasix. He has not been evaluated by cardiology before.  Review of previous labs: - A1c on 09/24/21 of 4.5 (no diabetes) - CMP WNL on 09/24/21 - Vitamin D low at 12.65 on 09/24/21 (intermittent supplementation) - Vitamin B12 of 337 (09/24/21) - Hgb of 14.0 (wnl) on 01/06/22  Pertinent ROS were reviewed with the patient and found to be negative unless otherwise specified above in HPI.   Assessment & Plan: Visit Diagnoses:  1. Pain in right ankle and joints of right foot   2. Bilateral lower extremity edema   3. Heart palpitations   4. Paresthesias   5. Neuropathy involving both lower extremities   6. Hallux rigidus, bilateral     Plan: Discussed with Isaiah Miller regarding his pain in feet/ankles that this is likely a combination of arthritic change of the feet and some degree of chronic ankle instability - this is exacerbated with his job for standing long periods of time. Can fill out note for working recommending this as do think would be beneficial. Would likely benefit from shorter working days or increased seated breaks for the feet. Greens sports insoles provided today for his working boots/shoes.  In terms of his lower extremity neuropathy, we talked about possible etiologies.  He has responding positively to gabapentin in the past, will restart '600mg'$  BID. He had normal Vitamin B12 levels, no diabetes. He does use ETOH on weekends, could be contributing, but he does have at least 1+ pitting edema bilaterally of the legs and feet - this swelling can be irritating the superficial nerves.  Given his lower extremity edema, heart palpitations and family history of CHF I would like to send him to cardiology for further evaluation.  Labs that we could obtain to rule out nutritional/vitamin deficiencies causing neuropathy with the vitamin B1, vitamin B6, iron/ferritin levels.  These will be ordered on an outpatient basis he may get these obtained when he has the blood work. He would likely benefit from compression stockings for the swelling, but want to have cardiac work-up in addition. Referral was sent. He will follow-up with me and/or his PCP as necessary.  Follow-up: With cardiology; see me as needed  Meds & Orders:  Meds ordered this encounter  Medications   gabapentin (NEURONTIN) 600 MG tablet    Sig: Take 1 tablet (600 mg total) by mouth 2 (two) times daily.    Dispense:  180 tablet    Refill:  1   ibuprofen (ADVIL) 800 MG tablet    Sig: Take 1 tablet (800 mg total) by mouth every 8 (eight) hours as needed.    Dispense:  90 tablet    Refill:  1    Orders Placed This Encounter  Procedures   XR Ankle 2 Views Right    Fe+TIBC+Fer   Vitamin B1   Vitamin B6   Ambulatory referral to Cardiology    Procedures: No procedures performed      Clinical History: No specialty comments available.  He reports that he has never smoked. He has never used smokeless tobacco.  Recent Labs    09/24/21 0917  HGBA1C 4.5*    Objective:    Physical Exam  Gen: Well-appearing, in no acute distress; non-toxic CV: RRR, normal S1 and S2 without murmurs, gallops, or rubs. Infrequent extra beat (likely PVC vs. PAC) Resp: Breathing unlabored on room air; lungs clear to auscultation both anteriorly and posteriorly without wheezing, rhonchi or rails. Psych: Fluid speech in conversation; appropriate affect; normal thought process Neuro: Sensation intact throughout. No gross coordination deficits.   Ortho Exam - Bilateral feet/ankles: Evaluation of bilateral feet and ankle shows 1+ pitting edema bilateral feet up to the midshin bilaterally.  No significant bony TTP.  There is hallux rigidus of bilateral first MTP joints.  No surrounding erythema or redness.  There is some laxity with inversion/eversion stress testing, consistent with likely chronic ankle instability. 5/5 strength testing in all directions. Mild loss of longitudinal arch but no excessive pronation/supination through gait.  Imaging: XR Ankle 2 Views Right  Result Date: 03/12/2022 2 views of the right ankle were ordered and reviewed by myself, including AP and lateral film.  There is a large plantar calcaneal spur.  There is a chronic enthesiopathy changes of the medial malleolus.  Visualized but arthritic change at the first MTP joint of the toe.  No acute fracture noted.   XR Ankle 2 Views Right 2 views of the right ankle were ordered and reviewed by myself, including  AP and lateral film.  There is a large plantar calcaneal spur.  There is a  chronic enthesiopathy changes of the medial malleolus.  Visualized but  arthritic change at the first MTP joint of the  toe.  No acute fracture  noted.   Past Medical/Family/Surgical/Social History: Medications & Allergies reviewed per EMR, new medications updated. Patient Active Problem List   Diagnosis Date Noted   Paresthesias 09/24/2021   Elevated LDL cholesterol level 09/24/2021   Left ankle swelling 09/22/2020   Elevated glucose 08/31/2019   Snores 07/06/2019   Impacted cerumen of left ear 08/15/2018   Viral upper respiratory tract infection 02/20/2018   Primary insomnia 07/29/2017   Healthcare maintenance 03/28/2015   Morbid obesity (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 EtOH   Ulcerative colitis    dx 1987 left and then more universal   Vitamin D deficiency    Family History  Problem Relation Age of Onset   Diabetes Mother  Colon cancer Neg Hx    Rectal cancer Neg Hx    Stomach cancer Neg Hx    Esophageal cancer Neg Hx    Pancreatic cancer Neg Hx    Colon polyps Neg Hx    Heart disease Neg Hx    Kidney disease Neg Hx    Liver disease Neg Hx    Past Surgical History:  Procedure Laterality Date   CHOLECYSTECTOMY     COLONOSCOPY W/ BIOPSIES  05/2006, 03/2009   ulcerative colitis left and right, no dysplasia   KNEE SURGERY Right    Social History   Occupational History   Occupation: Company secretary: [employer]  Tobacco Use   Smoking status: Never   Smokeless tobacco: Never  Vaping Use   Vaping Use: Never used  Substance and Sexual Activity   Alcohol use: Yes    Alcohol/week: 4.0 - 6.0 standard drinks of alcohol    Types: 4 - 6 Standard drinks or equivalent per week    Comment: occasional    Drug use: Yes    Types: Marijuana    Comment: some nights to help with sleep   Sexual activity: Yes    Comment: married

## 2022-03-12 ENCOUNTER — Encounter: Payer: Self-pay | Admitting: Sports Medicine

## 2022-03-12 MED ORDER — IBUPROFEN 800 MG PO TABS: 800.0000 mg | ORAL_TABLET | Freq: Three times a day (TID) | ORAL | 1 refills | Status: AC | PRN

## 2022-03-12 MED ORDER — GABAPENTIN 600 MG PO TABS: 600.0000 mg | ORAL_TABLET | Freq: Two times a day (BID) | ORAL | 1 refills | Status: DC

## 2022-03-30 ENCOUNTER — Other Ambulatory Visit: Payer: Self-pay | Admitting: Sports Medicine

## 2022-03-30 DIAGNOSIS — R202 Paresthesia of skin: Secondary | ICD-10-CM

## 2022-03-30 DIAGNOSIS — G5793 Unspecified mononeuropathy of bilateral lower limbs: Secondary | ICD-10-CM

## 2022-03-30 MED ORDER — GABAPENTIN 600 MG PO TABS: 600.0000 mg | ORAL_TABLET | Freq: Two times a day (BID) | ORAL | 1 refills | Status: DC

## 2022-03-30 NOTE — Progress Notes (Signed)
Re-filled Gabapentin. 646m BID.  DElba Barman DO Primary Care Sports Medicine Physician  [place]

## 2022-04-02 ENCOUNTER — Encounter (HOSPITAL_BASED_OUTPATIENT_CLINIC_OR_DEPARTMENT_OTHER): Payer: Self-pay | Admitting: Cardiology

## 2022-04-02 ENCOUNTER — Ambulatory Visit (HOSPITAL_BASED_OUTPATIENT_CLINIC_OR_DEPARTMENT_OTHER): Payer: Commercial Managed Care - PPO | Admitting: Cardiology

## 2022-04-02 VITALS — BP 146/70 | HR 71 | Ht 70.0 in | Wt 252.0 lb

## 2022-04-02 DIAGNOSIS — I10 Essential (primary) hypertension: Secondary | ICD-10-CM

## 2022-04-02 DIAGNOSIS — R600 Localized edema: Secondary | ICD-10-CM

## 2022-04-02 DIAGNOSIS — E7800 Pure hypercholesterolemia, unspecified: Secondary | ICD-10-CM

## 2022-04-02 DIAGNOSIS — R002 Palpitations: Secondary | ICD-10-CM | POA: Diagnosis not present

## 2022-04-02 DIAGNOSIS — Z7189 Other specified counseling: Secondary | ICD-10-CM

## 2022-04-02 MED ORDER — ATORVASTATIN CALCIUM 20 MG PO TABS: 20.0000 mg | ORAL_TABLET | Freq: Every day | ORAL | 3 refills | Status: DC

## 2022-04-02 NOTE — Progress Notes (Signed)
Cardiology Office Note:    Date:  04/02/2022   ID:  Isaiah Miller, Isaiah Miller Feb 07, 1956, MRN [id number]  PCP:  Libby Maw, MD  Cardiologist:  Buford Dresser, MD  Referring MD: Elba Barman, DO   CC: BLE edema, palpitations   History of Present Illness:    Kemauri Duty is a 67 y.o. male with a hx of GERD, ulcerative colitis, and hepatitis, who is seen as a new consult at the request of Elba Barman, DO for the evaluation and management of BLE edema and palpitations.  He was seen by Elba Barman, DO 03/11/2022 for chronic bilateral foot pain and neuropathy. He noted LE edema for 6 months. He also complained of intermittent palpitations about 2-3 times a week. Given that he also has a family history of congestive heart failure he was referred to cardiology for further evaluation.  Today, he is accompanied by his wife.  Tachycardia/palpitations: -Initial onset: In the past 3 months or so. -Frequency/Duration: Racing/fluttering that lasts 30 seconds to a minute. -Associated symptoms: Lightheadedness.  -Aggravating/alleviating factors: Generally he feels strange during the palpitations. When they occur he will stop his current activity and rest until his symptoms resolve spontaneously. He states there may be stress that is contributory as well. -Syncope/near syncope: Not because of the palpitations. -Prior cardiac history:  None.  -Prior workup:  Myocardial perfusion 01/2005, Chest CTA 12/2004 personally reviewed. -Prior treatment: None. -Possible medication interactions:  None. -Alcohol: None this month. He has been trying to lose weight. Otherwise on the weekends he may drink 6-8 beers a day. -Tobacco: Never. -Comorbidities: None. 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 Recently his work day was decreased from 10 hours to 8 hours which has helped. During work he is also walking a fair amount which relieves some swelling. Current diet: He  admits to using salt in his diet. -Labs: TSH, kidney function/electrolytes, CBC reviewed. -Cardiac ROS: He complains of BLE edema ongoing for 6 months. He notices sock lines daily. His swelling is worse by the end of the day, but is back to normal the next morning. The swelling also occurs with sitting for long periods. No prior injuries or surgeries on his legs. He has been on gabapentin for 3 months which improved his tingling sensations. He did have swelling prior to starting the gabapentin. He states that every so often it is harder for him to breathe.  -Family history: He denies any family history of cardiovascular disease.   He denies any chest pain, headaches, orthopnea, or PND.  Past Medical History:  Diagnosis Date   GERD (gastroesophageal reflux disease)    Hepatitis    ? EtOH   Ulcerative colitis    dx 1987 left and then more universal   Vitamin D deficiency     Past Surgical History:  Procedure Laterality Date   CHOLECYSTECTOMY     COLONOSCOPY W/ BIOPSIES  05/2006, 03/2009   ulcerative colitis left and right, no dysplasia   KNEE SURGERY Right     Current Medications: Current Outpatient Medications on File Prior to Visit  Medication Sig   carbamide peroxide (DEBROX) 6.5 % OTIC solution Place 5 drops into both ears 2 (two) times daily.   folic acid (FOLVITE) 1 MG tablet Take 1 tablet (1 mg total) by mouth daily.   gabapentin (NEURONTIN) 600 MG tablet Take 1 tablet (600 mg total) by mouth 2 (two) times daily.   ibuprofen (ADVIL) 800 MG tablet  Take 1 tablet (800 mg total) by mouth every 8 (eight) hours as needed.   sulfaSALAzine (AZULFIDINE) 500 MG tablet Take 4 tablets (2,000 mg total) by mouth 2 (two) times daily.   Vitamin D, Ergocalciferol, (DRISDOL) 1.25 MG (50000 UNIT) CAPS capsule Take 1 capsule (50,000 Units total) by mouth every 7 (seven) days.   No current facility-administered medications on file prior to visit.     Allergies:   Patient has no known allergies.    Social History   Tobacco Use   Smoking status: Never   Smokeless tobacco: Never  Vaping Use   Vaping Use: Never used  Substance Use Topics   Alcohol use: Yes    Alcohol/week: 4.0 - 6.0 standard drinks of alcohol    Types: 4 - 6 Standard drinks or equivalent per week    Comment: occasional    Drug use: Yes    Types: Marijuana    Comment: some nights to help with sleep    Family History: family history includes Diabetes in his mother. There is no history of Colon cancer, Rectal cancer, Stomach cancer, Esophageal cancer, Pancreatic cancer, Colon polyps, Heart disease, Kidney disease, or Liver disease.  ROS:   Please see the history of present illness.  Additional pertinent ROS: Constitutional: Negative for chills, fever, night sweats, unintentional weight loss  HENT: Negative for ear pain and hearing loss.   Eyes: Negative for loss of vision and eye pain.  Respiratory: Negative for cough, sputum, wheezing.   Cardiovascular: See HPI. Gastrointestinal: Negative for abdominal pain, melena, and hematochezia.  Genitourinary: Negative for dysuria and hematuria.  Musculoskeletal: Negative for falls and myalgias.  Skin: Negative for itching and rash.  Neurological: Negative for focal weakness, focal sensory changes and loss of consciousness.  Endo/Heme/Allergies: Does not bruise/bleed easily.     EKGs/Labs/Other Studies Reviewed:    The following studies were reviewed today:  CTA Chest  12/22/2004: IMPRESSION:  1. No evidence of pulmonary embolism.   2. Atherosclerotic calcifications in the left anterior descending artery, which are somewhat prominent for age. Correlate with possible cardiac causes.   3. Minimal pleural thickening and subpleural lymph nodes as described.   EKG:  EKG is personally reviewed.   04/02/2022:  NSR at 71 bpm with iRBBB  Recent Labs: 09/24/2021: ALT 25; BUN 16; Creatinine, Ser 0.92; Potassium 4.6; Sodium 138 01/06/2022: Hemoglobin 14.0; Platelets  223.0   Recent Lipid Panel    Component Value Date/Time   CHOL 250 (H) 09/24/2021 0917   TRIG 93.0 09/24/2021 0917   HDL 46.50 09/24/2021 0917   CHOLHDL 5 09/24/2021 0917   VLDL 18.6 09/24/2021 0917   LDLCALC 185 (H) 09/24/2021 0917   LDLDIRECT 132.0 08/31/2019 0916    Physical Exam:    VS:  BP (!) 146/70   Pulse 71   Ht '5\' 10"'$  (1.778 m)   Wt 252 lb (114.3 kg)   BMI 36.16 kg/m     Wt Readings from Last 3 Encounters:  04/02/22 252 lb (114.3 kg)  01/06/22 256 lb (116.1 kg)  09/24/21 244 lb 6.4 oz (110.9 kg)    GEN: Well nourished, well developed in no acute distress HEENT: Normal, moist mucous membranes NECK: No JVD CARDIAC: regular rhythm, normal S1 and S2, no rubs or gallops. No murmur. VASCULAR: Radial and DP pulses 2+ bilaterally. No carotid bruits RESPIRATORY:  Clear to auscultation without rales, wheezing or rhonchi  ABDOMEN: Soft, non-tender, non-distended MUSCULOSKELETAL:  Ambulates independently SKIN: Warm and dry, bilateral trace-1+ pitting  edema LE NEUROLOGIC:  Alert and oriented x 3. No focal neuro deficits noted. PSYCHIATRIC:  Normal affect    ASSESSMENT:    1. Heart palpitations   2. Bilateral leg edema   3. Pure hypercholesterolemia   4. Essential hypertension   5. Cardiac risk counseling   6. Counseling on health promotion and disease prevention    PLAN:    Palpitations -discussed options. If becomes more frequent/bothersome, would pursue monitor  Bilateral LE edema -check echocardiogram  Hypertension -elevated today, on no antihypertensives -monitor -if elevated at follow up, consider chlorthalidone given LE edema  Hypercholesterolemia -start atorvastatin 20 mg daily (previously ordered but not yet started) -recheck lipids/LFTs in 2-3 months  Cardiac risk counseling and prevention recommendations: -recommend heart healthy/Mediterranean diet, with whole grains, fruits, vegetable, fish, lean meats, nuts, and olive oil. Limit  salt. -recommend moderate walking, 3-5 times/week for 30-50 minutes each session. Aim for at least 150 minutes.week. Goal should be pace of 3 miles/hours, or walking 1.5 miles in 30 minutes -recommend avoidance of tobacco products. Avoid excess alcohol. -ASCVD risk score: The 10-year ASCVD risk score (Arnett DK, et al., 2019) is: 19%   Values used to calculate the score:     Age: 89 years     Sex: Male     Is Non-Hispanic African American: No     Diabetic: No     Tobacco smoker: No     Systolic Blood Pressure: 123456 mmHg     Is BP treated: No     HDL Cholesterol: 46.5 mg/dL     Total Cholesterol: 250 mg/dL    Plan for follow up: TBD based on results of echo, or sooner as needed.  Buford Dresser, MD, PhD, [facility]    Medication Adjustments/Labs and Tests Ordered: Current medicines are reviewed at length with the patient today.  Concerns regarding medicines are outlined above.   Orders Placed This Encounter  Procedures   Lipid panel   Hepatic function panel   EKG 12-Lead   ECHOCARDIOGRAM COMPLETE   Meds ordered this encounter  Medications   DISCONTD: atorvastatin (LIPITOR) 20 MG tablet    Sig: Take 1 tablet (20 mg total) by mouth daily.    Dispense:  90 tablet    Refill:  3   Patient Instructions  Medication Instructions:  START- Atorvastatin 20 mg by mouth daily  *If you need a refill on your cardiac medications before your next appointment, please call your pharmacy*   Lab Work: Fasting lipid and Liver in 2 months  If you have labs (blood work) drawn today and your tests are completely normal, you will receive your results only by: [place] (if you have MyChart) OR A paper copy in the mail If you have any lab test that is abnormal or we need to change your treatment, we will call you to review the results.   Testing/Procedures: Your physician has requested that you have an echocardiogram. Echocardiography is a painless test  that uses sound waves to create images of your heart. It provides your doctor with information about the size and shape of your heart and how well your heart's chambers and valves are working. This procedure takes approximately one hour. There are no restrictions for this procedure. Please do NOT wear cologne, perfume, aftershave, or lotions (deodorant is allowed). Please arrive 15 minutes prior to your appointment time.    Follow-Up: At [facility], you and your health needs are our priority.  As part of our continuing mission to provide you with exceptional heart care, we have created designated Provider Care Teams.  These Care Teams include your primary Cardiologist (physician) and Advanced Practice Providers (APPs -  Physician Assistants and Nurse Practitioners) who all work together to provide you with the care you need, when you need it.  We recommend signing up for the patient portal called "MyChart".  Sign up information is provided on this After Visit Summary.  MyChart is used to connect with patients for Virtual Visits (Telemedicine).  Patients are able to view lab/test results, encounter notes, upcoming appointments, etc.  Non-urgent messages can be sent to your provider as well.   To learn more about what you can do with MyChart, go to [email or web address].    Your next appointment:   2-3 month(s)  Provider:   Buford Dresser, MD    Other Instructions     I,Mathew Stumpf,acting as a scribe for Buford Dresser, MD.,have documented all relevant documentation on the behalf of Buford Dresser, MD,as directed by  Buford Dresser, MD while in the presence of Buford Dresser, MD.  I, Buford Dresser, MD, have reviewed all documentation for this visit. The documentation on 04/10/22 for the exam, diagnosis, procedures, and orders are all accurate and complete.   Signed, Buford Dresser, MD PhD 04/02/2022     [place]

## 2022-04-02 NOTE — Patient Instructions (Signed)
Medication Instructions:  START- Atorvastatin 20 mg by mouth daily  *If you need a refill on your cardiac medications before your next appointment, please call your pharmacy*   Lab Work: Fasting lipid and Liver in 2 months  If you have labs (blood work) drawn today and your tests are completely normal, you will receive your results only by: [place] (if you have MyChart) OR A paper copy in the mail If you have any lab test that is abnormal or we need to change your treatment, we will call you to review the results.   Testing/Procedures: Your physician has requested that you have an echocardiogram. Echocardiography is a painless test that uses sound waves to create images of your heart. It provides your doctor with information about the size and shape of your heart and how well your heart's chambers and valves are working. This procedure takes approximately one hour. There are no restrictions for this procedure. Please do NOT wear cologne, perfume, aftershave, or lotions (deodorant is allowed). Please arrive 15 minutes prior to your appointment time.    Follow-Up: At [facility], you and your health needs are our priority.  As part of our continuing mission to provide you with exceptional heart care, we have created designated Provider Care Teams.  These Care Teams include your primary Cardiologist (physician) and Advanced Practice Providers (APPs -  Physician Assistants and Nurse Practitioners) who all work together to provide you with the care you need, when you need it.  We recommend signing up for the patient portal called "MyChart".  Sign up information is provided on this After Visit Summary.  MyChart is used to connect with patients for Virtual Visits (Telemedicine).  Patients are able to view lab/test results, encounter notes, upcoming appointments, etc.  Non-urgent messages can be sent to your provider as well.   To learn more about what you can do with MyChart, go  to [email or web address].    Your next appointment:   2-3 month(s)  Provider:   Buford Dresser, MD    Other Instructions

## 2022-04-05 ENCOUNTER — Telehealth (HOSPITAL_BASED_OUTPATIENT_CLINIC_OR_DEPARTMENT_OTHER): Payer: Self-pay

## 2022-04-05 DIAGNOSIS — E7800 Pure hypercholesterolemia, unspecified: Secondary | ICD-10-CM

## 2022-04-05 MED ORDER — ATORVASTATIN CALCIUM 20 MG PO TABS: 20.0000 mg | ORAL_TABLET | Freq: Every day | ORAL | 3 refills | Status: DC

## 2022-04-05 NOTE — Telephone Encounter (Signed)
Received fax from [facility] requesting refills for Atorvastatin 20 mg. Rx request sent to pharmacy.

## 2022-04-10 ENCOUNTER — Encounter (HOSPITAL_BASED_OUTPATIENT_CLINIC_OR_DEPARTMENT_OTHER): Payer: Self-pay | Admitting: Cardiology

## 2022-04-26 ENCOUNTER — Ambulatory Visit (INDEPENDENT_AMBULATORY_CARE_PROVIDER_SITE_OTHER): Payer: Commercial Managed Care - PPO

## 2022-04-26 DIAGNOSIS — R600 Localized edema: Secondary | ICD-10-CM

## 2022-04-26 LAB — ECHOCARDIOGRAM COMPLETE
Area-P 1/2: 3.77 cm2
MV M vel: 5.21 m/s
MV Peak grad: 108.6 mmHg
S' Lateral: 2.58 cm

## 2022-05-12 ENCOUNTER — Other Ambulatory Visit: Payer: Self-pay | Admitting: Sports Medicine

## 2022-05-12 DIAGNOSIS — G5793 Unspecified mononeuropathy of bilateral lower limbs: Secondary | ICD-10-CM

## 2022-05-25 ENCOUNTER — Encounter: Payer: Self-pay | Admitting: Internal Medicine

## 2022-05-28 LAB — HEPATIC FUNCTION PANEL
ALT: 29 IU/L (ref 0–44)
AST: 29 IU/L (ref 0–40)
Albumin: 4.2 g/dL (ref 3.9–4.9)
Alkaline Phosphatase: 80 IU/L (ref 44–121)
Bilirubin Total: 0.4 mg/dL (ref 0.0–1.2)
Bilirubin, Direct: 0.12 mg/dL (ref 0.00–0.40)
Total Protein: 6.5 g/dL (ref 6.0–8.5)

## 2022-05-28 LAB — LIPID PANEL
Chol/HDL Ratio: 3.7 ratio (ref 0.0–5.0)
Cholesterol, Total: 160 mg/dL (ref 100–199)
HDL: 43 mg/dL (ref >39)
LDL Chol Calc (NIH): 103 mg/dL — ABNORMAL HIGH (ref 0–99)
Triglycerides: 74 mg/dL (ref 0–149)
VLDL Cholesterol Cal: 14 mg/dL (ref 5–40)

## 2022-06-03 ENCOUNTER — Ambulatory Visit: Payer: Commercial Managed Care - PPO | Admitting: Neurology

## 2022-06-03 ENCOUNTER — Encounter: Payer: Self-pay | Admitting: Neurology

## 2022-06-03 ENCOUNTER — Telehealth: Payer: Self-pay | Admitting: Neurology

## 2022-06-03 VITALS — BP 138/77 | HR 65 | Ht 70.0 in | Wt 251.4 lb

## 2022-06-03 DIAGNOSIS — R351 Nocturia: Secondary | ICD-10-CM

## 2022-06-03 DIAGNOSIS — R200 Anesthesia of skin: Secondary | ICD-10-CM

## 2022-06-03 DIAGNOSIS — E669 Obesity, unspecified: Secondary | ICD-10-CM | POA: Diagnosis not present

## 2022-06-03 DIAGNOSIS — Z9189 Other specified personal risk factors, not elsewhere classified: Secondary | ICD-10-CM

## 2022-06-03 DIAGNOSIS — R202 Paresthesia of skin: Secondary | ICD-10-CM

## 2022-06-03 DIAGNOSIS — M79671 Pain in right foot: Secondary | ICD-10-CM | POA: Diagnosis not present

## 2022-06-03 DIAGNOSIS — M79672 Pain in left foot: Secondary | ICD-10-CM

## 2022-06-03 DIAGNOSIS — R0683 Snoring: Secondary | ICD-10-CM

## 2022-06-03 DIAGNOSIS — G5712 Meralgia paresthetica, left lower limb: Secondary | ICD-10-CM

## 2022-06-03 DIAGNOSIS — G4719 Other hypersomnia: Secondary | ICD-10-CM

## 2022-06-03 DIAGNOSIS — G479 Sleep disorder, unspecified: Secondary | ICD-10-CM

## 2022-06-03 NOTE — Addendum Note (Signed)
Addended by: [name] on: 06/03/2022 09:14 AM   Modules accepted: Orders

## 2022-06-03 NOTE — Telephone Encounter (Signed)
Sent mychart msg informing pt of appointment change 

## 2022-06-03 NOTE — Patient Instructions (Addendum)
You may have a condition called peripheral neuropathy, i. e. nerve damage. Unfortunately, as I mentioned, there is no specific treatment for most neuropathies. The most common cause for neuropathy is diabetes in this [place], in which case, tight glucose control is key.  Some studies suggest that obesity and prediabetes can also cause nerve damage even in the absence of a formal diagnosis of diabetes.    Other causes include thyroid disease, and some vitamin deficiencies. Certain medications such as chemotherapy agents and other chemicals or toxins including alcohol can cause neuropathy. There are some genetic conditions or hereditary neuropathies. Typically patients will report a family history of neuropathy in those conditions. There are cases associated with cancers and autoimmune conditions. Most neuropathies are progressive unless a root cause can be found and treated, which is rare, as I explained. For most neuropathies there is no actual cure or reversing of symptoms. Painful neuropathy can be difficult to treat symptomatically, but there are some medications available to ease the symptoms.  Thankfully, you have no significant pain symptoms at this time and can be monitored for symptoms.    Electrophysiologic testing with nerve conduction velocity studies and EMG (muscle testing) do not always pick up neuropathies that affect the smallest fibers. Other common tests include different type of blood work, and rarely, spinal fluid testing, and sometimes we resort to asking for a nerve and muscle biopsy.  We can also consider specialist input from a neuromuscular specialist, sometimes we make referrals to [facility] or [facility] or [facility].  For now, as discussed, we will proceed with further work-up from my end of things:  We will check blood work today and call you with the test results. 2.  We will do an EMG and nerve conduction velocity test, which is an electrical nerve and muscle test, which we  will schedule. We will call you with the results. 3. I will order a home sleep test. Based on your symptoms and your exam I believe you are at risk for obstructive sleep apnea (aka OSA). We should proceed with a sleep study to determine whether you do or do not have OSA and how severe it is. Even, if you have mild OSA, I may want you to consider treatment with CPAP, as treatment of even borderline or mild sleep apnea can result and improvement of symptoms such as sleep disruption, daytime sleepiness, nighttime bathroom breaks, restless leg symptoms, improvement of headache syndromes, even improved mood disorder.  Please remember, the long-term risks and ramifications of untreated moderate to severe obstructive sleep apnea may include (but are not limited to): increased risk for cardiovascular disease, including congestive heart failure, stroke, difficult to control hypertension, treatment resistant obesity, arrhythmias, especially irregular heartbeat commonly known as A. Fib. (atrial fibrillation); even type 2 diabetes has been linked to untreated OSA.   Other correlations that untreated obstructive sleep apnea include macular edema which is swelling of the retina in the eyes, droopy eyelid syndrome, and elevated hemoglobin and hematocrit levels (often referred to as polycythemia). 4. We will consider an autoPAP machine for treatment of sleep apnea, if you have it.  5. Please reduce your alcohol consumption to less than 1 serving per day on average.

## 2022-06-03 NOTE — Progress Notes (Signed)
Subjective:    Patient ID: Isaiah Miller is a 67 y.o. male.  HPI    [name], MD, PhD [facility] [address], [address] [address] [place], [place] [address]  Dear Dr. Shon Baton,  I saw your patient, Isaiah Miller, upon your kind request in my sleep clinic today for initial consultation of his lower extremity pain and paresthesias, concern for neuropathy.  The patient is unaccompanied today.  As you know Isaiah Miller is a 67 year old male with an underlying medical history of ulcerative colitis, vitamin D deficiency, hepatitis, reflux disease, arthritis, hyperlipidemia, palpitations, lower extremity edema, and obesity, who reports an approximately one year history of bilateral foot pain and numbness.  His numbness is primarily on the top of his feet.  He has at times stinging sensations in the feet, symptoms are worse at night.  He has not fallen.  He has not had any foot drop.  He denies any radiating low back pain but in the past month and a half he has had numbness in the lateral left thigh area, no severe pain, no twitching, no significant symptoms in the upper extremities.  He has no family history of neuropathy.  He tries to hydrate well, he limits his caffeine to less than 1 serving per day, has cut back on his tea intake.  Weight has been more or less stable.  He does not sleep well.  He takes gabapentin twice daily, sometimes more at night to help him sleep.  He does not wake up rested, Epworth sleepiness score is 11 out of 24, fatigue severity score is 19 out of 63.  He has sleep disruption with nocturia at least twice per night.  He denies recurrent morning headaches or nocturnal headaches, no sudden onset of one-sided weakness or numbness or tingling or droopy face or slurring of speech.  He does drink alcohol, particularly on Friday and Saturday nights, usually 8-10 beers.  He smokes marijuana about 2-3 times a week at night to help him sleep.  He does not smoke  cigarettes.  He lives with his wife, they sleep in separate bedrooms because of their snoring.  He would be willing to get evaluated for sleep apnea at this time.  He has had palpitations.  He has not been taking a vitamin B12 supplement.  He works as a Psychologist, occupational.   I reviewed your office note from 03/11/2022.he was found to have edema.  He was recommended to start compression stockings and a referral to cardiology was made.  He has since then seen cardiology on 04/02/2002 and I reviewed the note. He had a subsequent echocardiogram on 04/26/2022 and I reviewed the report: EF was 55 to 60%.  He had right ventricular enlargement, he had severe asymmetric left ventricular hypertrophy to the inferior segments.  He had mild to moderate mitral regurgitation.  He had no evidence of aortic stenosis or mitral valve stenosis.  He has a follow-up upcoming with cardiology next month. He was supposed to have blood work through your office including vitamin B6, B1, and iron studies but it does not look like he had these drawn.  He had recent blood work through PCP on 09/24/2021 including vitamin B12, PSA, vitamin D, lipid panel, A1c, CBC, CMP.  I reviewed those test results.  Vitamin B12 was on the low end of normal at 337.  He was advised to take an over-the-counter vitamin B12 supplement.  CMP showed BUN of 16, creatinine 0.92, alk phos 75,  AST 22, ALT 25.  His A1c was 4.5.  Lipid panel showed elevated total cholesterol at 250, LDL elevated at 185, triglycerides 93.  He was advised to start atorvastatin.  Vitamin D was low at 12.65.  He was started on prescription vitamin D.  PSA was in the normal range at 2.54.I had evaluated him several years ago for sleep apnea concern but he did not pursue sleep testing at the time.   Previously:   09/28/17: 67 year old right-handed gentleman with an underlying medical history of ulcerative colitis, vitamin D deficiency, hepatitis, reflux disease, status post knee surgery, status post  cholecystectomy, and obesity, who reports sleep onset difficulties for a few months. His wife endorses that he snores quite loudly at times and that he also makes gasping sounds and positive his breathing. Typically, she sleeps in a separate bedroom, she has sleep apnea and uses a CPAP machine. He would be willing to get tested for sleep apnea and even consider a CPAP machine if it comes to that point. He is not aware of any family history of OSA. He denies telltale symptoms of restless leg syndrome but has chronic knee pain, right more than left, has been taking ibuprofen 800 mg each night for that. He had seen orthopedics in the past and received a cortisone injection years ago to the right knee. His bedtime is typically around 10, rise time around 5. He does not have night to night nocturia or morning headaches. He has a dog in his bedroom, typically on the bed. He has a TV on at night but tries to turn it off before falling asleep.  For chronic sleep difficulty he has recently been started on Restoril. I reviewed your office note from 09/09/2017, at which time the Restoril was increased from 15 mg to 30 mg strength. He feels a little better after taking Restoril. His Epworth sleepiness score is 8/24, fatigue score is 29 out of 63. He is married and lives with his wife, they have 3 children. He is a nonsmoker and utilizes alcohol occasionally, typically on weekends, does not utilize caffeine on a day-to-day basis. He takes sulfasalazine for his ulcerative colitis. Works as a Psychologist, occupational.    His Past Medical History Is Significant For: Past Medical History:  Diagnosis Date   GERD (gastroesophageal reflux disease)    Hepatitis    ? EtOH   Ulcerative colitis    dx 1987 left and then more universal   Vitamin D deficiency     His Past Surgical History Is Significant For: Past Surgical History:  Procedure Laterality Date   CHOLECYSTECTOMY     COLONOSCOPY W/ BIOPSIES  05/2006, 03/2009   ulcerative colitis  left and right, no dysplasia   KNEE SURGERY Right     His Family History Is Significant For: Family History  Problem Relation Age of Onset   Diabetes Mother    Colon cancer Neg Hx    Rectal cancer Neg Hx    Stomach cancer Neg Hx    Esophageal cancer Neg Hx    Pancreatic cancer Neg Hx    Colon polyps Neg Hx    Heart disease Neg Hx    Kidney disease Neg Hx    Liver disease Neg Hx    Neuropathy Neg Hx     His Social History Is Significant For: Social History   Socioeconomic History   Marital status: Married    Spouse name: Not on file   Number of children: 3   Years  of education: Not on file   Highest education level: Not on file  Occupational History   Occupation: Patent attorney: [employer]  Tobacco Use   Smoking status: Never   Smokeless tobacco: Never  Vaping Use   Vaping Use: Never used  Substance and Sexual Activity   Alcohol use: Not Currently    Alcohol/week: 5.0 standard drinks of alcohol    Types: 5 Cans of beer per week   Drug use: Yes    Types: Marijuana    Comment: some nights to help with sleep   Sexual activity: Yes    Comment: married  Other Topics Concern   Not on file  Social History Narrative   Occasional caffeine    Social Determinants of Health   Financial Resource Strain: Low Risk  (08/15/2018)   Overall Financial Resource Strain (CARDIA)    Difficulty of Paying Living Expenses: Not very hard  Food Insecurity: No Food Insecurity (08/15/2018)   Hunger Vital Sign    Worried About Running Out of Food in the Last Year: Never true    Ran Out of Food in the Last Year: Never true  Transportation Needs: No Transportation Needs (08/15/2018)   PRAPARE - Administrator, Civil Service (Medical): No    Lack of Transportation (Non-Medical): No  Physical Activity: Unknown (08/15/2018)   Exercise Vital Sign    Days of Exercise per Week: 1 day    Minutes of Exercise per Session: Not on file  Stress: No Stress Concern Present  (08/15/2018)   [employer] of Occupational Health - Occupational Stress Questionnaire    Feeling of Stress : Only a little  Social Connections: Unknown (08/15/2018)   Social Connection and Isolation Panel [NHANES]    Frequency of Communication with Friends and Family: Three times a week    Frequency of Social Gatherings with Friends and Family: Three times a week    Attends Religious Services: Not on file    Active Member of Clubs or Organizations: Not on file    Attends Club or Organization Meetings: Not on file    Marital Status: Married    His Allergies Are:  No Known Allergies:   His Current Medications Are:  Outpatient Encounter Medications as of 06/03/2022  Medication Sig   atorvastatin (LIPITOR) 20 MG tablet Take 1 tablet (20 mg total) by mouth daily.   carbamide peroxide (DEBROX) 6.5 % OTIC solution Place 5 drops into both ears 2 (two) times daily.   folic acid (FOLVITE) 1 MG tablet Take 1 tablet (1 mg total) by mouth daily.   gabapentin (NEURONTIN) 600 MG tablet Take 1 tablet (600 mg total) by mouth 2 (two) times daily.   sulfaSALAzine (AZULFIDINE) 500 MG tablet Take 4 tablets (2,000 mg total) by mouth 2 (two) times daily.   Vitamin D, Ergocalciferol, (DRISDOL) 1.25 MG (50000 UNIT) CAPS capsule Take 1 capsule (50,000 Units total) by mouth every 7 (seven) days.   No facility-administered encounter medications on file as of 06/03/2022.  :   Review of Systems:  Out of a complete 14 point review of systems, all are reviewed and negative with the exception of these symptoms as listed below:  Review of Systems  Neurological:        Pt here foe neuropathy pain Pt states Numbness in left thigh and both feet  Pt states burning and tingling in both feet and toes Pt states symptoms happen  at night     Objective:  Neurological Exam  Physical Exam Physical Examination:   Vitals:   06/03/22 0801  BP: 138/77  Pulse: 65    General Examination: The patient is a very  pleasant 67 y.o. male in no acute distress. He appears well-developed and well-nourished and very well groomed.   HEENT: Normocephalic, atraumatic, pupils are equal, round and reactive to light, extraocular tracking is good without limitation to gaze excursion or nystagmus noted. Hearing is grossly intact. Face is symmetric with normal facial animation. Speech is clear with no dysarthria noted. There is no hypophonia. There is no lip, neck/head, jaw or voice tremor. Neck is supple with full range of passive and active motion. There are no carotid bruits on auscultation. Oropharynx exam reveals: mild mouth dryness, adequate dental hygiene and marked airway crowding, due to thicker soft palate, uvula and throat are erythematous including tonsils which are about 2+ bilaterally, larger uvula noted.  Mallampati class III.  Neck circumference 18 three-quarter inches.  Tongue protrudes centrally and palate elevates symmetrically.  Good shoulder strength and neck strength.  Chest: Clear to auscultation without wheezing, rhonchi or crackles noted.  Heart: S1+S2+0, regular and normal without murmurs, rubs or gallops noted.   Abdomen: Soft, non-tender and non-distended.  Extremities: There is trace pitting edema in the distal lower extremities bilaterally.  Good pedal pulses.  No discoloration.  Skin: Warm and dry without trophic changes noted.   Musculoskeletal: exam reveals no obvious joint deformities.  Reports stiffness in his toes.  Neurologically:  Mental status: The patient is awake, alert and oriented in all 4 spheres. His immediate and remote memory, attention, language skills and fund of knowledge are appropriate. There is no evidence of aphasia, agnosia, apraxia or anomia. Speech is clear with normal prosody and enunciation. Thought process is linear. Mood is normal and affect is normal.  Cranial nerves II - XII are as described above under HEENT exam.  Motor exam: Normal bulk, strength and tone  is noted. There is no obvious action or resting tremor. Reflexes are 1+ in the upper extremities, trace in the knees and absent in the ankles.  Toes are downgoing bilaterally. Fine motor skills and coordination: grossly intact in the upper and lower extremities. Cerebellar testing: No dysmetria or intention tremor. There is no truncal or gait ataxia.  No dysmetria noted. Sensory exam: intact to light touch, pinprick, temperature and vibration sense in the upper extremities, decreased sensation to pinprick, temperature and vibration sense in the feet, up to the ankles bilaterally.   Gait, station and balance: He stands and walks without difficulty.   Assessment and Plan:  In summary, Ceylon Arenson is a very pleasant 67 y.o.-year old male with an underlying medical history of ulcerative colitis, vitamin D deficiency, hepatitis, reflux disease, arthritis, hyperlipidemia, palpitations, lower extremity edema, and obesity, who presents for evaluation of his paresthesias and pain affecting both feet.  He has been taking gabapentin but sometimes takes more at night to help him sleep.  He also smokes marijuana at night.  History and physical exam are in keeping with peripheral neuropathy, he also has a history concerning for left meralgia paresthetica.  We talked about causes for neuropathy quite at length today.  We also talked about his sleep disturbance and risk for sleep apnea.  He had a recent echocardiogram which showed left ventricular hypertrophy, but also right ventricular hypertrophy.  He has had palpitations.  He has nocturia and does not sleep well.  Causes for neuropathy and contributing factors were discussed  at length.  He is advised that even obesity in the absence of prediabetes or diabetes can cause neuropathy.  Alcohol may be a contributor but probably not the cause.  He is advised to reduce his alcohol consumption and limit himself to less than 1 serving per day on average.  He is advised to  refrain from smoking marijuana and pursue a sleep study for evaluation for sleep apnea.  We will pursue additional blood work to look for treatable causes for neuropathy.  We will call him with the results.  We will do EMG and nerve conduction velocity testing through our office to look for signs of peripheral neuropathy and meralgia paresthetica.  He is advised to continue with his medication regimen.  He is advised to follow-up with cardiology as scheduled and primary care as planned.  He is advised that we will call him with his sleep test results and consider AutoPap therapy if he has sleep apnea.  He would be willing to consider this.  This was an extended visit of over 1 hour secondary to extended chart review, addressing multiple problems, counseling and coordination of care.  We will plan a follow-up after testing.  I answered all his questions today and he was in agreement. Thank you very much for allowing me to participate in the care of this nice patient. If I can be of any further assistance to you please do not hesitate to call me at [phone].  Sincerely,   [name], MD, PhD

## 2022-06-04 LAB — MULTIPLE MYELOMA PANEL, SERUM

## 2022-06-04 LAB — RHEUMATOID FACTOR: Rheumatoid fact SerPl-aCnc: 17 IU/mL — ABNORMAL HIGH (ref <14.0)

## 2022-06-07 LAB — MULTIPLE MYELOMA PANEL, SERUM

## 2022-06-07 LAB — VITAMIN B6

## 2022-06-08 LAB — MULTIPLE MYELOMA PANEL, SERUM

## 2022-06-08 LAB — RPR: RPR Ser Ql: NONREACTIVE

## 2022-06-08 LAB — C-REACTIVE PROTEIN: CRP: <1 mg/L (ref 0–10)

## 2022-06-09 ENCOUNTER — Telehealth: Payer: Self-pay | Admitting: *Deleted

## 2022-06-09 NOTE — Telephone Encounter (Signed)
-----   Message from [name], MD sent at 06/09/2022 12:02 PM EDT ----- Please call patient and advise him that his labs were benign, he does have a positive rheumatoid factor which can be seen in patients with arthritis, other autoimmune diseases and also rheumatoid arthritis.  It may be worth seeing a rheumatologist but I would like for him to discuss this with his primary care first. 1 test is pending which is the protein breakdown called serum protein electrophoresis.  It looks at his antibodies in the blood.  We will update if there is any abnormality there but for now, the only abnormality found was a positive rheumatoid factor.

## 2022-06-09 NOTE — Telephone Encounter (Signed)
Spoke to patient gave labwork results gave Dr Johny Sax recommendation to consult PCP due to positive rheumatoid factor  Pt expressed understanding and thanked me for calling Forward lab results to PCP

## 2022-06-10 LAB — MULTIPLE MYELOMA PANEL, SERUM
Alpha2 Glob SerPl Elph-Mcnc: 0.4 g/dL (ref 0.4–1.0)
Globulin, Total: 2.7 g/dL (ref 2.2–3.9)
IgA/Immunoglobulin A, Serum: 249 mg/dL (ref 61–437)
IgG (Immunoglobin G), Serum: 970 mg/dL (ref 603–1613)
IgM (Immunoglobulin M), Srm: 85 mg/dL (ref 20–172)

## 2022-06-10 LAB — ANA W/REFLEX: Anti Nuclear Antibody (ANA): NEGATIVE

## 2022-06-10 LAB — SEDIMENTATION RATE: Sed Rate: 2 mm/hr (ref 0–30)

## 2022-06-10 LAB — VITAMIN B1: Thiamine: 129.8 nmol/L (ref 66.5–200.0)

## 2022-06-10 LAB — AMMONIA: Ammonia: 57 ug/dL (ref 40–200)

## 2022-06-11 ENCOUNTER — Ambulatory Visit (HOSPITAL_BASED_OUTPATIENT_CLINIC_OR_DEPARTMENT_OTHER): Payer: Commercial Managed Care - PPO | Admitting: Cardiology

## 2022-06-11 ENCOUNTER — Encounter (HOSPITAL_BASED_OUTPATIENT_CLINIC_OR_DEPARTMENT_OTHER): Payer: Self-pay | Admitting: Cardiology

## 2022-06-11 VITALS — HR 66 | Ht 70.0 in | Wt 248.4 lb

## 2022-06-11 DIAGNOSIS — R600 Localized edema: Secondary | ICD-10-CM

## 2022-06-11 DIAGNOSIS — Z712 Person consulting for explanation of examination or test findings: Secondary | ICD-10-CM

## 2022-06-11 DIAGNOSIS — Z7189 Other specified counseling: Secondary | ICD-10-CM | POA: Diagnosis not present

## 2022-06-11 DIAGNOSIS — E7800 Pure hypercholesterolemia, unspecified: Secondary | ICD-10-CM

## 2022-06-11 NOTE — Progress Notes (Signed)
Cardiology Office Note:    Date:  06/11/2022   ID:  Isaiah Miller, Isaiah Miller 1955-09-23, MRN [id number]  PCP:  Mliss Sax, MD  Cardiologist:  Jodelle Red, MD  Referring MD: Mliss Sax,*   CC: follow up   History of Present Illness:    Isaiah Miller is a 67 y.o. male with a hx of GERD, ulcerative colitis, and hepatitis, who is seen for follow up today. I initially met him 04/02/22 as a new consult at the request of Mliss Sax,* for the evaluation and management of BLE edema and palpitations.  CV history: Rare palpitations. LE edema ongoing for 6 months. He notices sock lines daily. His swelling is worse by the end of the day, but is back to normal the next morning. The swelling also occurs with sitting for long periods. No prior injuries or surgeries on his legs. He has been on gabapentin for 3 months which improved his tingling sensations. He did have swelling prior to starting the gabapentin. He states that every so often it is harder for him to breathe.   Family history: He denies any family history of cardiovascular disease.   Today: Reviewed results of echo. Notable for mild-moderate MR (seen only in one view). Noted to have normal RV function but severely enlarged size. TR too low to assess RVSP, but RAP noted as 3 mmHg. I personally reviewed the images together. I think the enlarged RV is probably a visual/imaging error.  Leg swelling is stable, reviewed at length today. Palpitations have been not bothersome.  Denies chest pain, shortness of breath at rest or with normal exertion. No PND, orthopnea, or unexpected weight gain. No syncope.  Past Medical History:  Diagnosis Date   GERD (gastroesophageal reflux disease)    Hepatitis    ? EtOH   Ulcerative colitis    dx 1987 left and then more universal   Vitamin D deficiency     Past Surgical History:  Procedure Laterality Date   CHOLECYSTECTOMY     COLONOSCOPY W/ BIOPSIES   05/2006, 03/2009   ulcerative colitis left and right, no dysplasia   KNEE SURGERY Right     Current Medications: Current Outpatient Medications on File Prior to Visit  Medication Sig   atorvastatin (LIPITOR) 20 MG tablet Take 1 tablet (20 mg total) by mouth daily.   carbamide peroxide (DEBROX) 6.5 % OTIC solution Place 5 drops into both ears 2 (two) times daily.   gabapentin (NEURONTIN) 600 MG tablet Take 1 tablet (600 mg total) by mouth 2 (two) times daily.   sulfaSALAzine (AZULFIDINE) 500 MG tablet Take 4 tablets (2,000 mg total) by mouth 2 (two) times daily.   Vitamin D, Ergocalciferol, (DRISDOL) 1.25 MG (50000 UNIT) CAPS capsule Take 1 capsule (50,000 Units total) by mouth every 7 (seven) days.   No current facility-administered medications on file prior to visit.     Allergies:   Patient has no known allergies.   Social History   Tobacco Use   Smoking status: Never   Smokeless tobacco: Never  Vaping Use   Vaping Use: Never used  Substance Use Topics   Alcohol use: Not Currently    Alcohol/week: 5.0 standard drinks of alcohol    Types: 5 Cans of beer per week   Drug use: Yes    Types: Marijuana    Comment: some nights to help with sleep    Family History: family history includes Diabetes in his mother. There is no history  of Colon cancer, Rectal cancer, Stomach cancer, Esophageal cancer, Pancreatic cancer, Colon polyps, Heart disease, Kidney disease, Liver disease, or Neuropathy.  ROS:   Please see the history of present illness.  Additional pertinent ROS otherwise unremarkable.  EKGs/Labs/Other Studies Reviewed:    The following studies were reviewed today: Echo 04/26/22 1. Left ventricular ejection fraction, by estimation, is 55 to 60%. Left  ventricular ejection fraction by 3D volume is 57 %. The left ventricle has  normal function. The left ventricle has no regional wall motion  abnormalities. There is severe asymmetric   left ventricular hypertrophy of the  inferior segment. Left ventricular  diastolic parameters were normal. The global longitudinal strain is  normal.   2. Right ventricular systolic function is normal. The right ventricular  size is severely enlarged. Tricuspid regurgitation signal is inadequate  for assessing PA pressure.   3. Significant MR only see in one view (imaged 77). The mitral valve is  normal in structure. Mild to moderate mitral valve regurgitation. No  evidence of mitral stenosis.   4. The aortic valve is tricuspid. There is mild calcification of the  aortic valve. Aortic valve regurgitation is not visualized. Aortic valve  sclerosis is present, with no evidence of aortic valve stenosis.   5. The inferior vena cava is normal in size with greater than 50%  respiratory variability, suggesting right atrial pressure of 3 mmHg.    CTA Chest  12/22/2004: IMPRESSION:  1. No evidence of pulmonary embolism.   2. Atherosclerotic calcifications in the left anterior descending artery, which are somewhat prominent for age. Correlate with possible cardiac causes.   3. Minimal pleural thickening and subpleural lymph nodes as described.   EKG:  EKG is personally reviewed.   04/02/2022:  NSR at 71 bpm with iRBBB  Recent Labs: 09/24/2021: BUN 16; Creatinine, Ser 0.92; Potassium 4.6; Sodium 138 01/06/2022: Hemoglobin 14.0; Platelets 223.0 05/28/2022: ALT 29   Recent Lipid Panel    Component Value Date/Time   CHOL 160 05/28/2022 0834   TRIG 74 05/28/2022 0834   HDL 43 05/28/2022 0834   CHOLHDL 3.7 05/28/2022 0834   CHOLHDL 5 09/24/2021 0917   VLDL 18.6 09/24/2021 0917   LDLCALC 103 (H) 05/28/2022 0834   LDLDIRECT 132.0 08/31/2019 0916    Physical Exam:    VS:  Pulse 66   Ht 5\' 10"  (1.778 m)   Wt 248 lb 6.4 oz (112.7 kg)   SpO2 94%   BMI 35.64 kg/m     Wt Readings from Last 3 Encounters:  06/11/22 248 lb 6.4 oz (112.7 kg)  06/03/22 251 lb 6.4 oz (114 kg)  04/02/22 252 lb (114.3 kg)    GEN: Well nourished,  well developed in no acute distress HEENT: Normal, moist mucous membranes NECK: No JVD CARDIAC: regular rhythm, normal S1 and S2, no rubs or gallops. No murmur. VASCULAR: Radial and DP pulses 2+ bilaterally. No carotid bruits RESPIRATORY:  Clear to auscultation without rales, wheezing or rhonchi  ABDOMEN: Soft, non-tender, non-distended MUSCULOSKELETAL:  Ambulates independently SKIN: Warm and dry, bilateral trace-1+ pitting edema LE NEUROLOGIC:  Alert and oriented x 3. No focal neuro deficits noted. PSYCHIATRIC:  Normal affect    ASSESSMENT:    1. Bilateral leg edema   2. Encounter to discuss test results   3. Cardiac risk counseling   4. Pure hypercholesterolemia     PLAN:    Palpitations -discussed options. If becomes more frequent/bothersome, would pursue monitor  Bilateral LE edema -reviewed echo. As  noted above, overall no significant findings. I think the mention of enlarged RV is an imaging/measurement error, as it visually appears normal. Normal RA pressure -overall this supports venous insufficiency. Counseled on compression stockings, elevation, avoiding salt  History of elevated blood pressure reading -monitor -if elevated at future PCP visits, consider chlorthalidone given LE edema  Hypercholesterolemia -continue atorvastatin 20 mg daily  -LDL decreased from 185 to 782, at reasonable target for primary prevention  Cardiac risk counseling and prevention recommendations: -recommend heart healthy/Mediterranean diet, with whole grains, fruits, vegetable, fish, lean meats, nuts, and olive oil. Limit salt. -recommend moderate walking, 3-5 times/week for 30-50 minutes each session. Aim for at least 150 minutes.week. Goal should be pace of 3 miles/hours, or walking 1.5 miles in 30 minutes -recommend avoidance of tobacco products. Avoid excess alcohol. -ASCVD risk score: The 10-year ASCVD risk score (Arnett DK, et al., 2019) is: 13.5%   Values used to calculate the  score:     Age: 80 years     Sex: Male     Is Non-Hispanic African American: No     Diabetic: No     Tobacco smoker: No     Systolic Blood Pressure: 138 mmHg     Is BP treated: No     HDL Cholesterol: 43 mg/dL     Total Cholesterol: 160 mg/dL    Plan for follow up: as needed.  Jodelle Red, MD, PhD, [facility]  [facility]    Medication Adjustments/Labs and Tests Ordered: Current medicines are reviewed at length with the patient today.  Concerns regarding medicines are outlined above.   No orders of the defined types were placed in this encounter.  No orders of the defined types were placed in this encounter.  Patient Instructions  Medication Instructions:  Your physician recommends that you continue on your current medications as directed. Please refer to the Current Medication list given to you today.  *If you need a refill on your cardiac medications before your next appointment, please call your pharmacy*   Follow-Up: At [facility], you and your health needs are our priority.  As part of our continuing mission to provide you with exceptional heart care, we have created designated Provider Care Teams.  These Care Teams include your primary Cardiologist (physician) and Advanced Practice Providers (APPs -  Physician Assistants and Nurse Practitioners) who all work together to provide you with the care you need, when you need it.  We recommend signing up for the patient portal called "MyChart".  Sign up information is provided on this After Visit Summary.  MyChart is used to connect with patients for Virtual Visits (Telemedicine).  Patients are able to view lab/test results, encounter notes, upcoming appointments, etc.  Non-urgent messages can be sent to your provider as well.   To learn more about what you can do with MyChart, go to [email or web address].    Your next appointment:   Call us if you need [place]    Signed, Jodelle Red, MD  PhD 06/11/2022     [facility]

## 2022-06-11 NOTE — Patient Instructions (Signed)
Medication Instructions:  Your physician recommends that you continue on your current medications as directed. Please refer to the Current Medication list given to you today.  *If you need a refill on your cardiac medications before your next appointment, please call your pharmacy*  Follow-Up: At [facility], you and your health needs are our priority.  As part of our continuing mission to provide you with exceptional heart care, we have created designated Provider Care Teams.  These Care Teams include your primary Cardiologist (physician) and Advanced Practice Providers (APPs -  Physician Assistants and Nurse Practitioners) who all work together to provide you with the care you need, when you need it.  We recommend signing up for the patient portal called "MyChart".  Sign up information is provided on this After Visit Summary.  MyChart is used to connect with patients for Virtual Visits (Telemedicine).  Patients are able to view lab/test results, encounter notes, upcoming appointments, etc.  Non-urgent messages can be sent to your provider as well.   To learn more about what you can do with MyChart, go to [email or web address].    Your next appointment:   Call us if you need us!   

## 2022-06-20 ENCOUNTER — Other Ambulatory Visit: Payer: Self-pay | Admitting: Family Medicine

## 2022-06-20 DIAGNOSIS — E559 Vitamin D deficiency, unspecified: Secondary | ICD-10-CM

## 2022-07-12 ENCOUNTER — Telehealth: Payer: Self-pay

## 2022-07-12 NOTE — Telephone Encounter (Signed)
Spoke w/pt and he said he needs to speak with his wife first, will cb to schedule

## 2022-07-15 ENCOUNTER — Encounter: Payer: Commercial Managed Care - PPO | Admitting: Diagnostic Neuroimaging

## 2022-07-26 ENCOUNTER — Ambulatory Visit (INDEPENDENT_AMBULATORY_CARE_PROVIDER_SITE_OTHER): Payer: Commercial Managed Care - PPO | Admitting: Neurology

## 2022-07-26 ENCOUNTER — Ambulatory Visit: Payer: Self-pay | Admitting: Neurology

## 2022-07-26 DIAGNOSIS — R351 Nocturia: Secondary | ICD-10-CM

## 2022-07-26 DIAGNOSIS — G479 Sleep disorder, unspecified: Secondary | ICD-10-CM

## 2022-07-26 DIAGNOSIS — R200 Anesthesia of skin: Secondary | ICD-10-CM

## 2022-07-26 DIAGNOSIS — G4719 Other hypersomnia: Secondary | ICD-10-CM

## 2022-07-26 DIAGNOSIS — G608 Other hereditary and idiopathic neuropathies: Secondary | ICD-10-CM | POA: Insufficient documentation

## 2022-07-26 DIAGNOSIS — E669 Obesity, unspecified: Secondary | ICD-10-CM

## 2022-07-26 DIAGNOSIS — Z0289 Encounter for other administrative examinations: Secondary | ICD-10-CM

## 2022-07-26 DIAGNOSIS — M79671 Pain in right foot: Secondary | ICD-10-CM

## 2022-07-26 DIAGNOSIS — G5712 Meralgia paresthetica, left lower limb: Secondary | ICD-10-CM

## 2022-07-26 DIAGNOSIS — R0683 Snoring: Secondary | ICD-10-CM

## 2022-07-26 DIAGNOSIS — Z9189 Other specified personal risk factors, not elsewhere classified: Secondary | ICD-10-CM

## 2022-07-26 NOTE — Procedures (Signed)
Full Name: Isaiah Miller Gender: Male MRN #: [id number] Date of Birth: 1956-01-11    Visit Date: 07/26/2022 12:23 Age: 67 Years Examining Physician: Dr. Naomie Dean Referring Physician: Dr. [name] Height: 5 feet 10 inch  History: Patient with numbness and tingling in his feet, No low back pain or radicular symptoms.  Summary: NCS performed on bilateral lower extremities. Let peroneal motor nerve showed slightly decreased conduction velocity( fib head to ankle, 39/m/2, N>44). Other motor nerves were within normal limits. Bilateral sural and superficial peroneal motor nerve showed no response. Right radial nerve showed decreased amplitude (9 microvolts, normal greater than 15).  The right tibial F wave showed delayed latency (57.3 ms, normal less than 56 ms).  All remaining nerves (as indicated in the following tables) were within normal limits  EMG: Was performed on the right lower extremity. All muscles (as indicated in the following tables) were within normal limits.   .       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  No suggestion of mononeuropathy or radiculopathy or neurogenic muscle disease.  ------------------------------- Naomie Dean, M.D.  [facility] [address], [address] [place], [place] [address] Tel: [phone] Fax: [phone]  Verbal informed consent was obtained from the patient, patient was informed of potential risk of procedure, including bruising, bleeding, hematoma formation, infection, muscle weakness, muscle pain, numbness, among others.        MNC    Nerve / Sites Muscle Latency Ref. Amplitude Ref. Rel Amp Segments Distance Velocity Ref. Area    ms ms mV mV %  cm m/s m/s mVms  R Peroneal - EDB     Ankle EDB 5.2 ?6.5 4.1 ?2.0 100 Ankle - EDB 9   12.5     Fib head EDB 12.5  4.5  109 Fib head - Ankle 30 41 ?44 14.5     Pop  fossa EDB 15.1  4.4  98.1 Pop fossa - Fib head 12 45 ?44 15.3         Pop fossa - Ankle      L Peroneal - EDB     Ankle EDB 6.0 ?6.5 3.8 ?2.0 100 Ankle - EDB 9   12.0     Fib head EDB 12.6  3.3  86.2 Fib head - Ankle 26 39 ?44 12.3     Pop fossa EDB 14.9  3.2  97.4 Pop fossa - Fib head 10 44 ?44 12.7         Pop fossa - Ankle      R Tibial - AH     Ankle AH 5.6 ?5.8 8.1 ?4.0 100 Ankle - AH 9   26.7     Pop fossa AH 14.4  6.5  79.9 Pop fossa - Ankle 37 42 ?41 28.2  L Tibial - AH     Ankle AH 5.5 ?5.8 5.1 ?4.0 100 Ankle - AH 9   16.4     Pop fossa AH 15.1  4.5  88.2 Pop fossa - Ankle 40 41 ?41 17.6             SNC    Nerve / Sites Rec. Site Peak Lat Ref.  Amp Ref. Segments Distance    ms ms V V  cm  R Radial - Anatomical snuff box (Forearm)     Forearm Wrist 2.2 ?2.9 9 ?15 Forearm - Wrist 10  R Sural -  Ankle (Calf)     Calf Ankle NR ?4.4 NR ?6 Calf - Ankle 14  L Sural - Ankle (Calf)     Calf Ankle NR ?4.4 NR ?6 Calf - Ankle 14  R Superficial peroneal - Ankle     Lat leg Ankle NR ?4.4 NR ?6 Lat leg - Ankle 14  L Superficial peroneal - Ankle     Lat leg Ankle NR ?4.4 NR ?6 Lat leg - Ankle 14  R Ulnar - Orthodromic, (Dig V, Mid palm)     Dig V Wrist 3.1 ?3.1 6 ?5 Dig V - Wrist 12                   F  Wave    Nerve F Lat Ref.   ms ms  R Tibial - AH 57.3 ?56.0  L Tibial - AH 53.6 ?56.0         EMG Summary Table    Spontaneous MUAP Recruitment  Muscle IA Fib PSW Fasc Other Amp Dur. Poly Pattern  R. Vastus medialis Normal None None None _______ Normal Normal Normal Normal  R. Tibialis anterior Normal None None None _______ Normal Normal Normal Normal  R. Gastrocnemius (Medial head) Normal None None None _______ Normal Normal Normal Normal  R. Extensor hallucis longus Normal None None None _______ Normal Normal Normal Normal  R. Abductor hallucis Normal None None None _______ Normal Normal Normal Normal

## 2022-07-26 NOTE — Progress Notes (Signed)
      Full Name: Delvis Brechtel Gender: Male MRN #: [id number] Date of Birth: 12/26/1955    Visit Date: 07/26/2022 12:23 Age: 67 Years Examining Physician: Dr. Norva Bowe Referring Physician: Dr. Saima Athar Height: 5 feet 10 inch  History: Patient with numbness and tingling in his feet, No low back pain or radicular symptoms.  Summary: NCS performed on bilateral lower extremities. Let peroneal motor nerve showed slightly decreased conduction velocity( fib head to ankle, 39/m/2, N>44). Other motor nerves were within normal limits. Bilateral sural and superficial peroneal motor nerve showed no response. Right radial nerve showed decreased amplitude (9 microvolts, normal greater than 15).  The right tibial F wave showed delayed latency (57.3 ms, normal less than 56 ms).  All remaining nerves (as indicated in the following tables) were within normal limits  EMG: Was performed on the right lower extremity. All muscles (as indicated in the following tables) were within normal limits.   .       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  No suggestion of mononeuropathy or radiculopathy or neurogenic muscle disease.  ------------------------------- Kendall Arnell, M.D.  [facility] [address], [address] [place], [place] [address] Tel: [phone] Fax: [phone]  Verbal informed consent was obtained from the patient, patient was informed of potential risk of procedure, including bruising, bleeding, hematoma formation, infection, muscle weakness, muscle pain, numbness, among others.        MNC    Nerve / Sites Muscle Latency Ref. Amplitude Ref. Rel Amp Segments Distance Velocity Ref. Area    ms ms mV mV %  cm m/s m/s mVms  R Peroneal - EDB     Ankle EDB 5.2 ?6.5 4.1 ?2.0 100 Ankle - EDB 9   12.5     Fib head EDB 12.5  4.5  109 Fib head - Ankle 30 41 ?44 14.5     Pop  fossa EDB 15.1  4.4  98.1 Pop fossa - Fib head 12 45 ?44 15.3         Pop fossa - Ankle      L Peroneal - EDB     Ankle EDB 6.0 ?6.5 3.8 ?2.0 100 Ankle - EDB 9   12.0     Fib head EDB 12.6  3.3  86.2 Fib head - Ankle 26 39 ?44 12.3     Pop fossa EDB 14.9  3.2  97.4 Pop fossa - Fib head 10 44 ?44 12.7         Pop fossa - Ankle      R Tibial - AH     Ankle AH 5.6 ?5.8 8.1 ?4.0 100 Ankle - AH 9   26.7     Pop fossa AH 14.4  6.5  79.9 Pop fossa - Ankle 37 42 ?41 28.2  L Tibial - AH     Ankle AH 5.5 ?5.8 5.1 ?4.0 100 Ankle - AH 9   16.4     Pop fossa AH 15.1  4.5  88.2 Pop fossa - Ankle 40 41 ?41 17.6             SNC    Nerve / Sites Rec. Site Peak Lat Ref.  Amp Ref. Segments Distance    ms ms V V  cm  R Radial - Anatomical snuff box (Forearm)     Forearm Wrist 2.2 ?2.9 9 ?15 Forearm - Wrist 10  R Sural -   Ankle (Calf)     Calf Ankle NR ?4.4 NR ?6 Calf - Ankle 14  L Sural - Ankle (Calf)     Calf Ankle NR ?4.4 NR ?6 Calf - Ankle 14  R Superficial peroneal - Ankle     Lat leg Ankle NR ?4.4 NR ?6 Lat leg - Ankle 14  L Superficial peroneal - Ankle     Lat leg Ankle NR ?4.4 NR ?6 Lat leg - Ankle 14  R Ulnar - Orthodromic, (Dig V, Mid palm)     Dig V Wrist 3.1 ?3.1 6 ?5 Dig V - Wrist 11                   F  Wave    Nerve F Lat Ref.   ms ms  R Tibial - AH 57.3 ?56.0  L Tibial - AH 53.6 ?56.0         EMG Summary Table    Spontaneous MUAP Recruitment  Muscle IA Fib PSW Fasc Other Amp Dur. Poly Pattern  R. Vastus medialis Normal None None None _______ Normal Normal Normal Normal  R. Tibialis anterior Normal None None None _______ Normal Normal Normal Normal  R. Gastrocnemius (Medial head) Normal None None None _______ Normal Normal Normal Normal  R. Extensor hallucis longus Normal None None None _______ Normal Normal Normal Normal  R. Abductor hallucis Normal None None None _______ Normal Normal Normal Normal     

## 2022-07-27 ENCOUNTER — Telehealth: Payer: Self-pay | Admitting: Neurology

## 2022-07-27 NOTE — Telephone Encounter (Signed)
I called and spoke with the patient and provided the results of the nerve conduction study. He verbalized understanding and expressed appreciation for the call. All questions answered.  He stated he was at work when they called him regarding scheduling for the sleep study. He will call back to reach out to the sleep lab.

## 2022-07-27 NOTE — Telephone Encounter (Signed)
Pt stated someone called him yesterday to go over results from Nerve Conduction Study. Pt requesting a call back from nurse.

## 2022-08-23 ENCOUNTER — Telehealth: Payer: Self-pay

## 2022-08-23 DIAGNOSIS — K5100 Ulcerative (chronic) pancolitis without complications: Secondary | ICD-10-CM

## 2022-08-23 MED ORDER — PREDNISONE 10 MG PO TABS: ORAL_TABLET | ORAL | 0 refills | Status: DC

## 2022-08-23 NOTE — Telephone Encounter (Signed)
Meds ordered this encounter  Medications   predniSONE (DELTASONE) 10 MG tablet    Sig: Take 4 tablets (40 mg total) by mouth daily with breakfast for 3 days, THEN 3 tablets (30 mg total) daily with breakfast for 3 days, THEN 2 tablets (20 mg total) daily with breakfast for 7 days, THEN 1 tablet (10 mg total) daily with breakfast for 7 days, THEN 0.5 tablets (5 mg total) daily with breakfast for 7 days.    Dispense:  46 tablet    Refill:  0     He needs an APP appointment in next couple of weeks- one of held spots most likely  He needs to do a CBC also - can come and do that anytime

## 2022-08-23 NOTE — Telephone Encounter (Signed)
Onset x 1 week, urgent stools, bloating, no fever. Requesting prednisone Sir. Please advise.

## 2022-08-24 NOTE — Telephone Encounter (Signed)
Isaiah Miller has been informed of the plan. He has an appointment with Hyacinth Meeker PA on 08/31/2022 at 8:30AM and he will come get his CBC drawn and pick up his prednisone to get started on that.

## 2022-08-30 ENCOUNTER — Other Ambulatory Visit (INDEPENDENT_AMBULATORY_CARE_PROVIDER_SITE_OTHER): Payer: Commercial Managed Care - PPO

## 2022-08-30 DIAGNOSIS — K5100 Ulcerative (chronic) pancolitis without complications: Secondary | ICD-10-CM

## 2022-08-30 LAB — CBC WITH DIFFERENTIAL/PLATELET
Basophils Absolute: 0 10*3/uL (ref 0.0–0.1)
Basophils Relative: 0.2 % (ref 0.0–3.0)
Eosinophils Absolute: 0 10*3/uL (ref 0.0–0.7)
Eosinophils Relative: 0 % (ref 0.0–5.0)
HCT: 41.5 % (ref 39.0–52.0)
Hemoglobin: 13.4 g/dL (ref 13.0–17.0)
Lymphocytes Relative: 19.1 % (ref 12.0–46.0)
Lymphs Abs: 2.2 10*3/uL (ref 0.7–4.0)
MCHC: 32.3 g/dL (ref 30.0–36.0)
MCV: 101.1 fl — ABNORMAL HIGH (ref 78.0–100.0)
Monocytes Absolute: 0.9 10*3/uL (ref 0.1–1.0)
Monocytes Relative: 7.9 % (ref 3.0–12.0)
Neutro Abs: 8.4 10*3/uL — ABNORMAL HIGH (ref 1.4–7.7)
Neutrophils Relative %: 72.8 % (ref 43.0–77.0)
Platelets: 241 10*3/uL (ref 150.0–400.0)
RBC: 4.1 Mil/uL — ABNORMAL LOW (ref 4.22–5.81)
RDW: 14.3 % (ref 11.5–15.5)
WBC: 11.5 10*3/uL — ABNORMAL HIGH (ref 4.0–10.5)

## 2022-08-31 ENCOUNTER — Ambulatory Visit: Payer: Commercial Managed Care - PPO | Admitting: Physician Assistant

## 2022-08-31 ENCOUNTER — Encounter: Payer: Self-pay | Admitting: Physician Assistant

## 2022-08-31 VITALS — BP 142/80 | HR 68 | Ht 70.0 in | Wt 251.0 lb

## 2022-08-31 DIAGNOSIS — K50119 Crohn's disease of large intestine with unspecified complications: Secondary | ICD-10-CM

## 2022-08-31 NOTE — Patient Instructions (Signed)
Continue Prednsione taper.   Continue Sulfasalazine.   _______________________________________________________  If your blood pressure at your visit was 140/90 or greater, please contact your primary care physician to follow up on this.  _______________________________________________________  If you are age 66 or older, your body mass index should be between 23-30. Your Body mass index is 36.01 kg/m. If this is out of the aforementioned range listed, please consider follow up with your Primary Care Provider.  If you are age 47 or younger, your body mass index should be between 19-25. Your Body mass index is 36.01 kg/m. If this is out of the aformentioned range listed, please consider follow up with your Primary Care Provider.   ________________________________________________________  The [facility] providers would like to encourage you to use [facility] to communicate with providers for non-urgent requests or questions.  Due to long hold times on the telephone, sending your provider a message by [facility] may be a faster and more efficient way to get a response.  Please allow 48 business hours for a response.  Please remember that this is for non-urgent requests.  _______________________________________________________

## 2022-08-31 NOTE — Progress Notes (Signed)
Chief Complaint: UC Flare  HPI:    Isaiah Miller is a  67 y/o male with a past medical history as listed below including ulcerative colitis and reflux, known to Dr. Leone Payor, who presents to clinic today with a "UC flare".    05/04/2019 colonoscopy with 1 diminutive polyp in the rectum, erythematous/granular/inflamed and ulcerated mucosa in the rectum and sigmoid colon and otherwise normal.  Colitis is active.  Tried to get him on Lialda but could not.  Told him need repeat colon in a year.    01/06/2022 patient seen in clinic by Dr. Leone Payor and at that time was clinically in remission and asymptomatic on Sulfasalazine.  Discussed that he had colitis in 2021 that was uncontrolled and was unable to afford Lialda which was part of the issue, discussed he needed a repeat colonoscopy but he was not ready to schedule.  He was started on folic acid.    08/23/2022 patient called in and described urgent stools with bloating for a week and was requesting Prednisone.  Dr. Leone Payor started him on Prednisone 40 mg daily for 3 days, then 30 mg for 3 days, then 20 mg for 7 days and then 10 mg for 7 days and then 5 mg for 7 days.  Also recommended labs.    08/30/2022 CBC with a white count of 11.5 (on Prednisone) .    Today, patient explains that he is down to Prednisone 30 mg and about to taper down to 25 mg and his symptoms are 90% better.  Initially he was bloated and had generalized abdominal pain and just felt "sluggish", with only 2-3 bowel movements a day and felt mostly constipated like he could not go at all.  Now he is back to no bowel movements a day or maybe 1 which is more his normal, he never saw any blood.  Again he feels a lot better.  Continues on Sulfasalazine.  Aware that he is due for a colonoscopy.    Denies fever, chills, weight loss or blood in his stool.  Past Medical History:  Diagnosis Date   GERD (gastroesophageal reflux disease)    Hepatitis    ? EtOH   Ulcerative colitis    dx 1987 left and  then more universal   Vitamin D deficiency     Past Surgical History:  Procedure Laterality Date   CHOLECYSTECTOMY     COLONOSCOPY W/ BIOPSIES  05/2006, 03/2009   ulcerative colitis left and right, no dysplasia   KNEE SURGERY Right     Current Outpatient Medications  Medication Sig Dispense Refill   atorvastatin (LIPITOR) 20 MG tablet Take 1 tablet (20 mg total) by mouth daily. 90 tablet 3   gabapentin (NEURONTIN) 600 MG tablet Take 1 tablet (600 mg total) by mouth 2 (two) times daily. 180 tablet 1   predniSONE (DELTASONE) 10 MG tablet Take 4 tablets (40 mg total) by mouth daily with breakfast for 3 days, THEN 3 tablets (30 mg total) daily with breakfast for 3 days, THEN 2 tablets (20 mg total) daily with breakfast for 7 days, THEN 1 tablet (10 mg total) daily with breakfast for 7 days, THEN 0.5 tablets (5 mg total) daily with breakfast for 7 days. 46 tablet 0   sulfaSALAzine (AZULFIDINE) 500 MG tablet Take 4 tablets (2,000 mg total) by mouth 2 (two) times daily. 720 tablet 3   Vitamin D, Ergocalciferol, (DRISDOL) 1.25 MG (50000 UNIT) CAPS capsule TAKE 1 CAPSULE BY MOUTH EVERY 7 DAYS 12  capsule 0   No current facility-administered medications for this visit.    Allergies as of 08/31/2022   (No Known Allergies)    Family History  Problem Relation Age of Onset   Diabetes Mother    Colon cancer Neg Hx    Rectal cancer Neg Hx    Stomach cancer Neg Hx    Esophageal cancer Neg Hx    Pancreatic cancer Neg Hx    Colon polyps Neg Hx    Heart disease Neg Hx    Kidney disease Neg Hx    Liver disease Neg Hx    Neuropathy Neg Hx     Social History   Socioeconomic History   Marital status: Married    Spouse name: Not on file   Number of children: 3   Years of education: Not on file   Highest education level: Not on file  Occupational History   Occupation: Patent attorney: [employer]  Tobacco Use   Smoking status: Never   Smokeless tobacco: Never  Vaping Use    Vaping status: Never Used  Substance and Sexual Activity   Alcohol use: Not Currently    Alcohol/week: 5.0 standard drinks of alcohol    Types: 5 Cans of beer per week   Drug use: Yes    Types: Marijuana    Comment: some nights to help with sleep   Sexual activity: Yes    Comment: married  Other Topics Concern   Not on file  Social History Narrative   Occasional caffeine    Social Determinants of Health   Financial Resource Strain: Low Risk  (08/15/2018)   Overall Financial Resource Strain (CARDIA)    Difficulty of Paying Living Expenses: Not very hard  Food Insecurity: No Food Insecurity (08/15/2018)   Hunger Vital Sign    Worried About Running Out of Food in the Last Year: Never true    Ran Out of Food in the Last Year: Never true  Transportation Needs: No Transportation Needs (08/15/2018)   PRAPARE - Administrator, Civil Service (Medical): No    Lack of Transportation (Non-Medical): No  Physical Activity: Unknown (08/15/2018)   Exercise Vital Sign    Days of Exercise per Week: 1 day    Minutes of Exercise per Session: Not on file  Stress: No Stress Concern Present (08/15/2018)   [employer] of Occupational Health - Occupational Stress Questionnaire    Feeling of Stress : Only a little  Social Connections: Unknown (08/15/2018)   Social Connection and Isolation Panel [NHANES]    Frequency of Communication with Friends and Family: Three times a week    Frequency of Social Gatherings with Friends and Family: Three times a week    Attends Religious Services: Not on file    Active Member of Clubs or Organizations: Not on file    Attends Club or Organization Meetings: Not on file    Marital Status: Married  Intimate Partner Violence: Not At Risk (08/15/2018)   Humiliation, Afraid, Rape, and Kick questionnaire    Fear of Current or Ex-Partner: No    Emotionally Abused: No    Physically Abused: No    Sexually Abused: No    Review of Systems:    Constitutional: No  weight loss, fever or chills Cardiovascular: No chest pain   Respiratory: No SOB Gastrointestinal: See HPI and otherwise negative   Physical Exam:  Vital signs: BP (!) 142/80   Pulse 68   Ht 5'  10" (1.778 m)   Wt 251 lb (113.9 kg)   SpO2 99%   BMI 36.01 kg/m    Constitutional:   Pleasant Caucasian male appears to be in NAD, Well developed, Well nourished, alert and cooperative Respiratory: Respirations even and unlabored. Lungs clear to auscultation bilaterally.   No wheezes, crackles, or rhonchi.  Cardiovascular: Normal S1, S2. No MRG. Regular rate and rhythm. No peripheral edema, cyanosis or pallor.  Gastrointestinal:  Soft, nondistended, nontender. No rebound or guarding. Normal bowel sounds. No appreciable masses or hepatomegaly. Rectal:  Not performed.  Psychiatric: Demonstrates good judgement and reason without abnormal affect or behaviors.  RELEVANT LABS AND IMAGING: CBC    Component Value Date/Time   WBC 11.5 (H) 08/30/2022 1551   RBC 4.10 (L) 08/30/2022 1551   HGB 13.4 08/30/2022 1551   HCT 41.5 08/30/2022 1551   PLT 241.0 08/30/2022 1551   MCV 101.1 (H) 08/30/2022 1551   MCH 32.5 11/30/2013 1228   MCHC 32.3 08/30/2022 1551   RDW 14.3 08/30/2022 1551   LYMPHSABS 2.2 08/30/2022 1551   MONOABS 0.9 08/30/2022 1551   EOSABS 0.0 08/30/2022 1551   BASOSABS 0.0 08/30/2022 1551    CMP     Component Value Date/Time   NA 138 09/24/2021 0917   K 4.6 09/24/2021 0917   CL 104 09/24/2021 0917   CO2 27 09/24/2021 0917   GLUCOSE 98 09/24/2021 0917   BUN 16 09/24/2021 0917   CREATININE 0.92 09/24/2021 0917   CALCIUM 9.4 09/24/2021 0917   PROT 6.6 06/03/2022 0913   ALBUMIN 4.2 05/28/2022 0834   AST 29 05/28/2022 0834   ALT 29 05/28/2022 0834   ALKPHOS 80 05/28/2022 0834   BILITOT 0.4 05/28/2022 0834   GFRNONAA >90 11/30/2013 1228   GFRAA >90 11/30/2013 1228    Assessment: 1.  Ulcerative colitis with complication: Patient started on Prednisone taper, now on 30 mg  and feeling 90% better, continues on Sulfasalazine  Plan: 1.  Patient is overdue for a repeat colonoscopy.  We will schedule him 3 months out from now to allow him time to finish Pred taper and so we can get a look at how he is doing on Sulfasalazine.  Prior discussion about possibly changing him to Lialda.  Patient will have a nurse previsit call given that will be 90 days till appointment, but we went ahead and scheduled him today.  Did provide the patient a detailed list of risks for the procedure and he agrees to proceed. Patient is appropriate for endoscopic procedure(s) in the ambulatory ([facility]) setting.  2.  Continue Prednisone taper. 3.  Continue on Sulfasalazine. 4.  Patient will follow up per recommendations from Dr. Leone Payor after time of procedure.  Hyacinth Meeker, PA-C [facility] 08/31/2022, 8:30 AM  Cc: Mliss Sax

## 2022-09-15 ENCOUNTER — Encounter: Payer: Commercial Managed Care - PPO | Admitting: Family Medicine

## 2022-09-15 ENCOUNTER — Other Ambulatory Visit: Payer: Self-pay | Admitting: Sports Medicine

## 2022-09-15 DIAGNOSIS — G5793 Unspecified mononeuropathy of bilateral lower limbs: Secondary | ICD-10-CM

## 2022-09-15 DIAGNOSIS — R202 Paresthesia of skin: Secondary | ICD-10-CM

## 2022-09-16 ENCOUNTER — Other Ambulatory Visit: Payer: Self-pay | Admitting: Sports Medicine

## 2022-09-16 DIAGNOSIS — R202 Paresthesia of skin: Secondary | ICD-10-CM

## 2022-09-16 DIAGNOSIS — G5793 Unspecified mononeuropathy of bilateral lower limbs: Secondary | ICD-10-CM

## 2022-09-16 MED ORDER — IBUPROFEN 800 MG PO TABS: 800.0000 mg | ORAL_TABLET | Freq: Three times a day (TID) | ORAL | 0 refills | Status: DC | PRN

## 2022-09-16 MED ORDER — GABAPENTIN 600 MG PO TABS
600.0000 mg | ORAL_TABLET | Freq: Two times a day (BID) | ORAL | 1 refills | Status: DC
Start: 2022-09-16 — End: 2023-03-29

## 2022-09-24 ENCOUNTER — Encounter: Payer: Self-pay | Admitting: Internal Medicine

## 2022-09-24 ENCOUNTER — Other Ambulatory Visit: Payer: Self-pay | Admitting: Internal Medicine

## 2022-09-24 NOTE — Telephone Encounter (Signed)
Dr. Leone Payor, this patient last saw [facility] for UC flare. Symptoms returned after completing prednisone taper. Pt has requested a refill of Prednisone at this time. Please advise in Jennifer's absence. Thanks

## 2022-09-24 NOTE — Telephone Encounter (Signed)
Patient is requesting a refill. He sent a MyChart message today Sir. Feels like he is relapsing and now having lower back pain. Please advise on refill.

## 2022-09-24 NOTE — Telephone Encounter (Signed)
Mucoid stools and some diarrhea for several days  Some low back pain also  No fever  Prednisone Rx sent  I asked and he is willing to do colonoscopy sooner then currently scheduled so please contact him and arrange that if possible

## 2022-09-24 NOTE — Telephone Encounter (Signed)
Colonoscopy and pre-visit dates changed to be done sooner.

## 2022-09-29 ENCOUNTER — Ambulatory Visit (INDEPENDENT_AMBULATORY_CARE_PROVIDER_SITE_OTHER): Payer: Commercial Managed Care - PPO | Admitting: Family Medicine

## 2022-09-29 ENCOUNTER — Encounter: Payer: Self-pay | Admitting: Family Medicine

## 2022-09-29 VITALS — BP 134/84 | HR 72 | Temp 98.4°F | Ht 70.0 in | Wt 248.6 lb

## 2022-09-29 DIAGNOSIS — M545 Low back pain, unspecified: Secondary | ICD-10-CM

## 2022-09-29 DIAGNOSIS — R202 Paresthesia of skin: Secondary | ICD-10-CM

## 2022-09-29 DIAGNOSIS — Z0001 Encounter for general adult medical examination with abnormal findings: Secondary | ICD-10-CM | POA: Diagnosis not present

## 2022-09-29 DIAGNOSIS — Z0000 Encounter for general adult medical examination without abnormal findings: Secondary | ICD-10-CM | POA: Diagnosis not present

## 2022-09-29 DIAGNOSIS — R0683 Snoring: Secondary | ICD-10-CM

## 2022-09-29 DIAGNOSIS — R768 Other specified abnormal immunological findings in serum: Secondary | ICD-10-CM

## 2022-09-29 DIAGNOSIS — E559 Vitamin D deficiency, unspecified: Secondary | ICD-10-CM

## 2022-09-29 DIAGNOSIS — E538 Deficiency of other specified B group vitamins: Secondary | ICD-10-CM | POA: Diagnosis not present

## 2022-09-29 DIAGNOSIS — E7800 Pure hypercholesterolemia, unspecified: Secondary | ICD-10-CM | POA: Diagnosis not present

## 2022-09-29 DIAGNOSIS — R8290 Unspecified abnormal findings in urine: Secondary | ICD-10-CM

## 2022-09-29 DIAGNOSIS — Z125 Encounter for screening for malignant neoplasm of prostate: Secondary | ICD-10-CM

## 2022-09-29 DIAGNOSIS — G8929 Other chronic pain: Secondary | ICD-10-CM

## 2022-09-29 LAB — LIPID PANEL
Cholesterol: 192 mg/dL (ref 0–200)
HDL: 44.5 mg/dL (ref >39.00)
LDL Cholesterol: 128 mg/dL — ABNORMAL HIGH (ref 0–99)
NonHDL: 147.6
Total CHOL/HDL Ratio: 4
Triglycerides: 97 mg/dL (ref 0.0–149.0)
VLDL: 19.4 mg/dL (ref 0.0–40.0)

## 2022-09-29 LAB — COMPREHENSIVE METABOLIC PANEL
ALT: 28 U/L (ref 0–53)
AST: 21 U/L (ref 0–37)
Albumin: 4.4 g/dL (ref 3.5–5.2)
Alkaline Phosphatase: 62 U/L (ref 39–117)
BUN: 24 mg/dL — ABNORMAL HIGH (ref 6–23)
CO2: 30 meq/L (ref 19–32)
Calcium: 9.3 mg/dL (ref 8.4–10.5)
Chloride: 106 meq/L (ref 96–112)
Creatinine, Ser: 0.8 mg/dL (ref 0.40–1.50)
GFR: 92.46 mL/min (ref >60.00)
Glucose, Bld: 94 mg/dL (ref 70–99)
Potassium: 4.6 meq/L (ref 3.5–5.1)
Sodium: 142 meq/L (ref 135–145)
Total Bilirubin: 0.6 mg/dL (ref 0.2–1.2)
Total Protein: 6.7 g/dL (ref 6.0–8.3)

## 2022-09-29 LAB — URINALYSIS, ROUTINE W REFLEX MICROSCOPIC
Ketones, ur: NEGATIVE
Leukocytes,Ua: NEGATIVE
Nitrite: NEGATIVE
Specific Gravity, Urine: >=1.03 — AB (ref 1.000–1.030)
Total Protein, Urine: NEGATIVE
Urine Glucose: NEGATIVE
Urobilinogen, UA: 0.2 (ref 0.0–1.0)
pH: 6 (ref 5.0–8.0)

## 2022-09-29 LAB — CBC
HCT: 44.6 % (ref 39.0–52.0)
Hemoglobin: 14.3 g/dL (ref 13.0–17.0)
MCHC: 32.1 g/dL (ref 30.0–36.0)
MCV: 103.5 fl — ABNORMAL HIGH (ref 78.0–100.0)
Platelets: 231 10*3/uL (ref 150.0–400.0)
RBC: 4.31 Mil/uL (ref 4.22–5.81)
RDW: 14.3 % (ref 11.5–15.5)
WBC: 9.7 10*3/uL (ref 4.0–10.5)

## 2022-09-29 NOTE — Progress Notes (Addendum)
Established Patient Office Visit   Subjective:  Patient ID: Isaiah Miller, male    DOB: [birth year]  Age: 67 y.o. MRN: [id number]  Chief Complaint  Patient presents with   Annual Exam    CPE: Pt is fasting. No concerns    HPI Encounter Diagnoses  Name Primary?   Healthcare maintenance Yes   Paresthesias    Vitamin D deficiency    Morbid obesity (HCC)    B12 deficiency    Elevated LDL cholesterol level    Right-sided low back pain without sciatica, unspecified chronicity    Rheumatoid factor positive    Screening for prostate cancer    Snores    Abnormal urinalysis    For physical exam and follow-up of above.  Continues Neurontin per neurology for lower extremity paresthesias.  He denies early morning stiffness and swelling in any of his joints.  He does experience pain in some of the joints of his hands.  Recent ulcerative colitis flare.  There was concomitant right lower back pain that was nonradiating.  He does regular stretching exercises for his lower back at work.  No prior history of back problems.  Denies radiation of pain.  No change in his bowel or bladder function.  An extended prednisone taper has resolved his lower back pain.  Urine flow is good.  Continues weekly high-dose vitamin D tablet.  Continues atorvastatin for elevated LDL cholesterol with elevated ASCVD risk score.  Continues to supervise a welding shop.   Review of Systems  Constitutional: Negative.   HENT: Negative.    Eyes:  Negative for blurred vision, discharge and redness.  Respiratory: Negative.    Cardiovascular: Negative.   Gastrointestinal:  Negative for abdominal pain.  Genitourinary: Negative.   Musculoskeletal:  Positive for back pain. Negative for myalgias.  Skin:  Negative for rash.  Neurological:  Negative for tingling, loss of consciousness and weakness.  Endo/Heme/Allergies:  Negative for polydipsia.      09/29/2022   10:06 AM 09/24/2021    8:34 AM 09/24/2021    8:23 AM   Depression screen PHQ 2/9  Decreased Interest 0 0 0  Down, Depressed, Hopeless 0 0 0  PHQ - 2 Score 0 0 0  Altered sleeping 1 1   Tired, decreased energy 1 1   Change in appetite 0 0   Feeling bad or failure about yourself  0 0   Trouble concentrating 0 0   Moving slowly or fidgety/restless 0 0   Suicidal thoughts 0 0   PHQ-9 Score 2 2   Difficult doing work/chores Not difficult at all Not difficult at all       Current Outpatient Medications:    atorvastatin (LIPITOR) 20 MG tablet, Take 1 tablet (20 mg total) by mouth daily., Disp: 90 tablet, Rfl: 3   gabapentin (NEURONTIN) 600 MG tablet, Take 1 tablet (600 mg total) by mouth 2 (two) times daily., Disp: 180 tablet, Rfl: 1   ibuprofen (ADVIL) 800 MG tablet, Take 1 tablet (800 mg total) by mouth every 8 (eight) hours as needed. Take with food, Disp: 30 tablet, Rfl: 0   predniSONE (DELTASONE) 10 MG tablet, Take 4 tablets (40 mg total) by mouth daily with breakfast for 3 days, THEN 3 tablets (30 mg total) daily with breakfast for 3 days, THEN 2 tablets (20 mg total) daily with breakfast for 10 days, THEN 1 tablet (10 mg total) daily with breakfast for 10 days, THEN 0.5 tablets (5 mg total) daily with  breakfast for 10 days., Disp: 56 tablet, Rfl: 0   sulfaSALAzine (AZULFIDINE) 500 MG tablet, Take 4 tablets (2,000 mg total) by mouth 2 (two) times daily., Disp: 720 tablet, Rfl: 3   Vitamin D, Ergocalciferol, (DRISDOL) 1.25 MG (50000 UNIT) CAPS capsule, Take 1 capsule (50,000 Units total) by mouth every 7 (seven) days., Disp: 12 capsule, Rfl: 3   Objective:     BP 134/84   Pulse 72   Temp 98.4 F (36.9 C)   Ht 5\' 10"  (1.778 m)   Wt 248 lb 9.6 oz (112.8 kg)   SpO2 95%   BMI 35.67 kg/m  BP Readings from Last 3 Encounters:  09/29/22 134/84  08/31/22 (!) 142/80  07/26/22 (!) 157/84   Wt Readings from Last 3 Encounters:  09/29/22 248 lb 9.6 oz (112.8 kg)  08/31/22 251 lb (113.9 kg)  07/26/22 250 lb (113.4 kg)      Physical  Exam Constitutional:      General: He is not in acute distress.    Appearance: Normal appearance. He is not ill-appearing, toxic-appearing or diaphoretic.  HENT:     Head: Normocephalic and atraumatic.     Right Ear: Tympanic membrane, ear canal and external ear normal.     Left Ear: Tympanic membrane, ear canal and external ear normal.     Mouth/Throat:     Mouth: Mucous membranes are moist.     Pharynx: Oropharynx is clear. No oropharyngeal exudate or posterior oropharyngeal erythema.  Eyes:     General: No scleral icterus.       Right eye: No discharge.        Left eye: No discharge.     Extraocular Movements: Extraocular movements intact.     Conjunctiva/sclera: Conjunctivae normal.     Pupils: Pupils are equal, round, and reactive to light.  Cardiovascular:     Rate and Rhythm: Normal rate and regular rhythm.  Pulmonary:     Effort: Pulmonary effort is normal. No respiratory distress.     Breath sounds: Normal breath sounds.  Abdominal:     General: Bowel sounds are normal.     Tenderness: There is no abdominal tenderness. There is no guarding or rebound.  Musculoskeletal:     Cervical back: No rigidity or tenderness.     Lumbar back: No tenderness or bony tenderness. Normal range of motion. Negative right straight leg raise test and negative left straight leg raise test.  Skin:    General: Skin is warm and dry.  Neurological:     Mental Status: He is alert and oriented to person, place, and time.     Motor: No weakness.     Deep Tendon Reflexes:     Reflex Scores:      Patellar reflexes are 1+ on the right side and 1+ on the left side.      Achilles reflexes are 1+ on the right side and 1+ on the left side. Psychiatric:        Mood and Affect: Mood normal.        Behavior: Behavior normal.      Results for orders placed or performed in visit on 09/29/22  Vitamin B12  Result Value Ref Range   Vitamin B-12 431 211 - 911 pg/mL  CBC  Result Value Ref Range   WBC  9.7 4.0 - 10.5 K/uL   RBC 4.31 4.22 - 5.81 Mil/uL   Platelets 231.0 150.0 - 400.0 K/uL   Hemoglobin 14.3 13.0 - 17.0 g/dL  HCT 44.6 39.0 - 52.0 %   MCV 103.5 (H) 78.0 - 100.0 fl   MCHC 32.1 30.0 - 36.0 g/dL   RDW 44.0 10.2 - 72.5 %  Comprehensive metabolic panel  Result Value Ref Range   Sodium 142 135 - 145 mEq/L   Potassium 4.6 3.5 - 5.1 mEq/L   Chloride 106 96 - 112 mEq/L   CO2 30 19 - 32 mEq/L   Glucose, Bld 94 70 - 99 mg/dL   BUN 24 (H) 6 - 23 mg/dL   Creatinine, Ser 3.66 0.40 - 1.50 mg/dL   Total Bilirubin 0.6 0.2 - 1.2 mg/dL   Alkaline Phosphatase 62 39 - 117 U/L   AST 21 0 - 37 U/L   ALT 28 0 - 53 U/L   Total Protein 6.7 6.0 - 8.3 g/dL   Albumin 4.4 3.5 - 5.2 g/dL   GFR 44.03 >47.42 mL/min   Calcium 9.3 8.4 - 10.5 mg/dL  Lipid panel  Result Value Ref Range   Cholesterol 192 0 - 200 mg/dL   Triglycerides 59.5 0.0 - 149.0 mg/dL   HDL 63.87 >56.43 mg/dL   VLDL 32.9 0.0 - 51.8 mg/dL   LDL Cholesterol 841 (H) 0 - 99 mg/dL   Total CHOL/HDL Ratio 4    NonHDL 147.60   PSA  Result Value Ref Range   PSA 2.91 0.10 - 4.00 ng/mL  Urinalysis, Routine w reflex microscopic  Result Value Ref Range   Color, Urine YELLOW Yellow;Lt. Yellow;Straw;Dark Yellow;Amber;Green;Red;Brown   APPearance Sl Cloudy (A) Clear;Turbid;Slightly Cloudy;Cloudy   Specific Gravity, Urine >=1.030 (A) 1.000 - 1.030   pH 6.0 5.0 - 8.0   Total Protein, Urine NEGATIVE Negative   Urine Glucose NEGATIVE Negative   Ketones, ur NEGATIVE Negative   Bilirubin Urine SMALL (A) Negative   Hgb urine dipstick SMALL (A) Negative   Urobilinogen, UA 0.2 0.0 - 1.0   Leukocytes,Ua NEGATIVE Negative   Nitrite NEGATIVE Negative   WBC, UA 0-2/hpf 0-2/hpf   RBC / HPF 3-6/hpf (A) 0-2/hpf   Mucus, UA Presence of (A) None   Squamous Epithelial / HPF Rare(0-4/hpf) Rare(0-4/hpf)  VITAMIN D 25 Hydroxy (Vit-D Deficiency, Fractures)  Result Value Ref Range   VITD 25.86 (L) 30.00 - 100.00 ng/mL      The 10-year ASCVD  risk score (Arnett DK, et al., 2019) is: 15.3%    Assessment & Plan:   Healthcare maintenance -     CBC -     Urinalysis, Routine w reflex microscopic  Paresthesias  Vitamin D deficiency -     VITAMIN D 25 Hydroxy (Vit-D Deficiency, Fractures) -     Vitamin D (Ergocalciferol); Take 1 capsule (50,000 Units total) by mouth every 7 (seven) days.  Dispense: 12 capsule; Refill: 3  Morbid obesity (HCC)  B12 deficiency -     Vitamin B12 -     CBC  Elevated LDL cholesterol level -     Comprehensive metabolic panel -     Lipid panel  Right-sided low back pain without sciatica, unspecified chronicity  Rheumatoid factor positive  Screening for prostate cancer -     PSA  Snores  Abnormal urinalysis -     Urinalysis, Routine w reflex microscopic; Future    Return in about 6 months (around 04/01/2023).  Information was given on health maintenance and disease prevention.  Also encouraged exercise.  Information was given on exercising to lose weight.  Invited him again to see a sleep doctor about his apnea.  He is not interested at this time.  Will return if back pain persists past his current prednisone taper. Does not have symptoms and signs consistent with RA at this time. Adjustments to his medications will be made pending results of blood work today.  Mliss Sax, MD

## 2022-10-01 DIAGNOSIS — R8290 Unspecified abnormal findings in urine: Secondary | ICD-10-CM | POA: Insufficient documentation

## 2022-10-01 LAB — VITAMIN D 25 HYDROXY (VIT D DEFICIENCY, FRACTURES): VITD: 25.86 ng/mL — ABNORMAL LOW (ref 30.00–100.00)

## 2022-10-01 LAB — PSA: PSA: 2.91 ng/mL (ref 0.10–4.00)

## 2022-10-01 LAB — VITAMIN B12: Vitamin B-12: 431 pg/mL (ref 211–911)

## 2022-10-01 MED ORDER — VITAMIN D (ERGOCALCIFEROL) 1.25 MG (50000 UNIT) PO CAPS
50000.0000 [IU] | ORAL_CAPSULE | ORAL | 3 refills | Status: DC
Start: 2022-10-01 — End: 2023-03-09

## 2022-10-01 NOTE — Addendum Note (Signed)
Addended by: Andrez Grime on: 10/01/2022 12:01 PM   Modules accepted: Orders

## 2022-10-08 ENCOUNTER — Encounter: Payer: Self-pay | Admitting: Internal Medicine

## 2022-10-08 ENCOUNTER — Ambulatory Visit: Payer: Commercial Managed Care - PPO

## 2022-10-08 VITALS — Ht 70.0 in | Wt 245.0 lb

## 2022-10-08 DIAGNOSIS — K5100 Ulcerative (chronic) pancolitis without complications: Secondary | ICD-10-CM

## 2022-10-08 DIAGNOSIS — K50119 Crohn's disease of large intestine with unspecified complications: Secondary | ICD-10-CM

## 2022-10-08 NOTE — Progress Notes (Signed)
No egg or soy allergy known to patient  No issues known to pt with past sedation with any surgeries or procedures Patient denies ever being told they had issues or difficulty with intubation  No FH of Malignant Hyperthermia Pt is not on diet pills Pt is not on  home 02  Pt is not on blood thinners  Pt denies issues with constipation  No A fib or A flutter Have any cardiac testing pending--no  LOA: independent  Prep: spilt dose Miralax  Patient's chart reviewed by Cathlyn Parsons CNRA prior to previsit and patient appropriate for the [facility].  Previsit completed and red dot placed by patient's name on their procedure day (on provider's schedule).     PV competed with patient. Prep instructions sent via mychart. Pt is aware hard copy will not make it in time via mail and he can pick up a hard copy from the office if needed.

## 2022-10-14 ENCOUNTER — Encounter: Payer: Self-pay | Admitting: Internal Medicine

## 2022-10-14 ENCOUNTER — Ambulatory Visit (AMBULATORY_SURGERY_CENTER): Payer: Commercial Managed Care - PPO | Admitting: Internal Medicine

## 2022-10-14 VITALS — BP 120/77 | HR 73 | Temp 98.0°F | Resp 10 | Ht 70.0 in | Wt 245.0 lb

## 2022-10-14 DIAGNOSIS — K5100 Ulcerative (chronic) pancolitis without complications: Secondary | ICD-10-CM

## 2022-10-14 DIAGNOSIS — K529 Noninfective gastroenteritis and colitis, unspecified: Secondary | ICD-10-CM | POA: Diagnosis not present

## 2022-10-14 MED ORDER — SODIUM CHLORIDE 0.9 % IV SOLN: 500.0000 mL | INTRAVENOUS | Status: DC

## 2022-10-14 NOTE — Progress Notes (Signed)
[facility] History and Physical   Primary Care Physician:  Mliss Sax, MD   Reason for Procedure:   Ulcerative colitis  Plan:    colonoscopy     HPI: Isaiah Miller is a 67 y.o. male with chronic universal ulcerative colitis. He has experienced a recent flare treated with prednisone, while on maintenance sulfasalsazine.   Past Medical History:  Diagnosis Date   GERD (gastroesophageal reflux disease)    Hepatitis    ? EtOH   Ulcerative colitis    dx 1987 left and then more universal   Vitamin D deficiency     Past Surgical History:  Procedure Laterality Date   CHOLECYSTECTOMY     COLONOSCOPY W/ BIOPSIES  05/2006, 03/2009   ulcerative colitis left and right, no dysplasia   KNEE SURGERY Right     Prior to Admission medications   Medication Sig Start Date End Date Taking? Authorizing Provider  atorvastatin (LIPITOR) 20 MG tablet Take 1 tablet (20 mg total) by mouth daily. 04/05/22 03/31/23 Yes Jodelle Red, MD  gabapentin (NEURONTIN) 600 MG tablet Take 1 tablet (600 mg total) by mouth 2 (two) times daily. 09/16/22  Yes Madelyn Brunner, DO  predniSONE (DELTASONE) 10 MG tablet Take 4 tablets (40 mg total) by mouth daily with breakfast for 3 days, THEN 3 tablets (30 mg total) daily with breakfast for 3 days, THEN 2 tablets (20 mg total) daily with breakfast for 10 days, THEN 1 tablet (10 mg total) daily with breakfast for 10 days, THEN 0.5 tablets (5 mg total) daily with breakfast for 10 days. 09/24/22 10/30/22 Yes Iva Boop, MD  sulfaSALAzine (AZULFIDINE) 500 MG tablet Take 4 tablets (2,000 mg total) by mouth 2 (two) times daily. 01/06/22  Yes Iva Boop, MD  Vitamin D, Ergocalciferol, (DRISDOL) 1.25 MG (50000 UNIT) CAPS capsule Take 1 capsule (50,000 Units total) by mouth every 7 (seven) days. 10/01/22  Yes Mliss Sax, MD  ibuprofen (ADVIL) 800 MG tablet Take 1 tablet (800 mg total) by mouth every 8 (eight) hours as needed. Take  with food 09/16/22   Madelyn Brunner, DO    Current Outpatient Medications  Medication Sig Dispense Refill   atorvastatin (LIPITOR) 20 MG tablet Take 1 tablet (20 mg total) by mouth daily. 90 tablet 3   gabapentin (NEURONTIN) 600 MG tablet Take 1 tablet (600 mg total) by mouth 2 (two) times daily. 180 tablet 1   predniSONE (DELTASONE) 10 MG tablet Take 4 tablets (40 mg total) by mouth daily with breakfast for 3 days, THEN 3 tablets (30 mg total) daily with breakfast for 3 days, THEN 2 tablets (20 mg total) daily with breakfast for 10 days, THEN 1 tablet (10 mg total) daily with breakfast for 10 days, THEN 0.5 tablets (5 mg total) daily with breakfast for 10 days. 56 tablet 0   sulfaSALAzine (AZULFIDINE) 500 MG tablet Take 4 tablets (2,000 mg total) by mouth 2 (two) times daily. 720 tablet 3   Vitamin D, Ergocalciferol, (DRISDOL) 1.25 MG (50000 UNIT) CAPS capsule Take 1 capsule (50,000 Units total) by mouth every 7 (seven) days. 12 capsule 3   ibuprofen (ADVIL) 800 MG tablet Take 1 tablet (800 mg total) by mouth every 8 (eight) hours as needed. Take with food 30 tablet 0   Current Facility-Administered Medications  Medication Dose Route Frequency Provider Last Rate Last Admin   0.9 %  sodium chloride infusion  500 mL Intravenous Continuous Iva Boop, MD  Allergies as of 10/14/2022   (No Known Allergies)    Family History  Problem Relation Age of Onset   Diabetes Mother    Colon cancer Neg Hx    Rectal cancer Neg Hx    Stomach cancer Neg Hx    Esophageal cancer Neg Hx    Pancreatic cancer Neg Hx    Colon polyps Neg Hx    Heart disease Neg Hx    Kidney disease Neg Hx    Liver disease Neg Hx    Neuropathy Neg Hx     Social History   Socioeconomic History   Marital status: Married    Spouse name: Not on file   Number of children: 3   Years of education: Not on file   Highest education level: Not on file  Occupational History   Occupation: Patent attorney:  [employer]  Tobacco Use   Smoking status: Never   Smokeless tobacco: Never  Vaping Use   Vaping status: Never Used  Substance and Sexual Activity   Alcohol use: Yes    Alcohol/week: 5.0 standard drinks of alcohol    Types: 5 Cans of beer per week    Comment: occ   Drug use: Yes    Types: Marijuana    Comment: some nights to help with sleep   Sexual activity: Yes    Comment: married  Other Topics Concern   Not on file  Social History Narrative   Occasional caffeine    Social Determinants of Health   Financial Resource Strain: Low Risk  (08/15/2018)   Overall Financial Resource Strain (CARDIA)    Difficulty of Paying Living Expenses: Not very hard  Food Insecurity: No Food Insecurity (09/27/2022)   Hunger Vital Sign    Worried About Running Out of Food in the Last Year: Never true    Ran Out of Food in the Last Year: Never true  Transportation Needs: No Transportation Needs (09/27/2022)   PRAPARE - Administrator, Civil Service (Medical): No    Lack of Transportation (Non-Medical): No  Physical Activity: Insufficiently Active (09/27/2022)   Exercise Vital Sign    Days of Exercise per Week: 2 days    Minutes of Exercise per Session: 20 min  Stress: No Stress Concern Present (09/27/2022)   [employer] of Occupational Health - Occupational Stress Questionnaire    Feeling of Stress : Only a little  Social Connections: Unknown (09/27/2022)   Social Connection and Isolation Panel [NHANES]    Frequency of Communication with Friends and Family: Three times a week    Frequency of Social Gatherings with Friends and Family: Twice a week    Attends Religious Services: Patient declined    Database administrator or Organizations: Not on file    Attends Banker Meetings: Not on file    Marital Status: Married  Intimate Partner Violence: Not At Risk (08/15/2018)   Humiliation, Afraid, Rape, and Kick questionnaire    Fear of Current or Ex-Partner:  No    Emotionally Abused: No    Physically Abused: No    Sexually Abused: No    Review of Systems:  All other review of systems negative except as mentioned in the HPI.  Physical Exam: Vital signs BP (!) 141/85   Pulse 73   Temp 98 F (36.7 C)   Resp 14   Ht 5\' 10"  (1.778 m)   Wt 245 lb (111.1 kg)   SpO2 94%  BMI 35.15 kg/m   General:   Alert,  Well-developed, well-nourished, pleasant and cooperative in NAD Lungs:  Clear throughout to auscultation.   Heart:  Regular rate and rhythm; no murmurs, clicks, rubs,  or gallops. Abdomen:  Soft, nontender and nondistended. Normal bowel sounds.   Neuro/Psych:  Alert and cooperative. Normal mood and affect. A and O x 3   @Kameria Canizares  Sena Slate, MD, [facility] [phone] (pager) 10/14/2022 8:29 AM@

## 2022-10-14 NOTE — Progress Notes (Signed)
Called to room to assist during endoscopic procedure.  Patient ID and intended procedure confirmed with present staff. Received instructions for my participation in the procedure from the performing physician.  

## 2022-10-14 NOTE — Patient Instructions (Addendum)
There was slight inflammation in the sigmoid colon and rectum.  The prep was only fair - I could see but not as well as I wanted.  I will let you know what biopsies show - will be away next week so will be after that.  I appreciate the opportunity to care for you. Iva Boop, MD, [facility]  Handout provided on hemorrhoids.  Resume previous diet.  Continue present medications.  Await pathology results.  Repeat colonoscopy is recommended for surveillance. The colonoscopy date will be determined after pathology results from today's exam become available for review. Use different prep.   YOU HAD AN ENDOSCOPIC PROCEDURE TODAY AT THE [facility]:   Refer to the procedure report that was given to you for any specific questions about what was found during the examination.  If the procedure report does not answer your questions, please call your gastroenterologist to clarify.  If you requested that your care partner not be given the details of your procedure findings, then the procedure report has been included in a sealed envelope for you to review at your convenience later.  YOU SHOULD EXPECT: Some feelings of bloating in the abdomen. Passage of more gas than usual.  Walking can help get rid of the air that was put into your GI tract during the procedure and reduce the bloating. If you had a lower endoscopy (such as a colonoscopy or flexible sigmoidoscopy) you may notice spotting of blood in your stool or on the toilet paper. If you underwent a bowel prep for your procedure, you may not have a normal bowel movement for a few days.  Please Note:  You might notice some irritation and congestion in your nose or some drainage.  This is from the oxygen used during your procedure.  There is no need for concern and it should clear up in a day or so.  SYMPTOMS TO REPORT IMMEDIATELY:  Following lower endoscopy (colonoscopy or flexible sigmoidoscopy):  Excessive amounts of blood in the  stool  Significant tenderness or worsening of abdominal pains  Swelling of the abdomen that is new, acute  Fever of 100F or higher  For urgent or emergent issues, a gastroenterologist can be reached at any hour by calling [phone]. Do not use MyChart messaging for urgent concerns.    DIET:  We do recommend a small meal at first, but then you may proceed to your regular diet.  Drink plenty of fluids but you should avoid alcoholic beverages for 24 hours.  ACTIVITY:  You should plan to take it easy for the rest of today and you should NOT DRIVE or use heavy machinery until tomorrow (because of the sedation medicines used during the test).    FOLLOW UP: Our staff will call the number listed on your records the next business day following your procedure.  We will call around 7:15- 8:00 am to check on you and address any questions or concerns that you may have regarding the information given to you following your procedure. If we do not reach you, we will leave a message.     If any biopsies were taken you will be contacted by phone or by letter within the next 1-3 weeks.  Please call us at [phone] if you have not heard about the biopsies in 3 weeks.    SIGNATURES/CONFIDENTIALITY: You and/or your care partner have signed paperwork which will be entered into your electronic medical record.  These signatures attest to the fact that  that the information above on your After Visit Summary has been reviewed and is understood.  Full responsibility of the confidentiality of this discharge information lies with you and/or your care-partner.

## 2022-10-14 NOTE — Progress Notes (Signed)
Report to PACU, RN, vss, BBS= Clear.  

## 2022-10-14 NOTE — Op Note (Signed)
[facility] Patient Name: Isaiah Miller Procedure Date: 10/14/2022 8:23 AM MRN: [id number] Endoscopist: Iva Boop , MD, [id number] Age: 67 Referring MD:  Date of Birth: Dec 06, 1955 Gender: Male Account #: [id number] Procedure:                Colonoscopy Indications:              High risk colon cancer surveillance: Ulcerative                            pancolitis of 8 (or more) years duration, Last                            colonoscopy: 2021 On sulfasalazine 2 g bid and                            recent flare requiring prednisone - asymptomatic now Medicines:                Monitored Anesthesia Care Procedure:                After obtaining informed consent, the colonoscope                            was passed under direct vision. Throughout the                            procedure, the patient's blood pressure, pulse, and                            oxygen saturations were monitored continuously. The                            CF [id number] #[id number] was introduced through the anus                            and advanced to the the cecum, identified by                            appendiceal orifice and ileocecal valve. The                            colonoscopy was somewhat difficult due to fair                            prep. Successful completion of the procedure was                            aided by lavage. The patient tolerated the                            procedure well. The quality of the bowel                            preparation was fair. The ileocecal valve,  appendiceal orifice, and rectum were photographed.                            The bowel preparation used was Miralax via split                            dose instruction. Scope In: 8:40:40 AM Scope Out: 9:00:45 AM Scope Withdrawal Time: 0 hours 17 minutes 34 seconds  Total Procedure Duration: 0 hours 20 minutes 5 seconds  Findings:                 The perianal and digital  rectal examinations were                            normal. Pertinent negatives include normal prostate                            (size, shape, and consistency).                           Ulcerated mucosa were present in the rectum and in                            the sigmoid colon. Biopsies were taken with a cold                            forceps for histology. Verification of patient                            identification for the specimen was done. Estimated                            blood loss was minimal.                           External and internal hemorrhoids were found. The                            hemorrhoids were small.                           The exam was otherwise without abnormality on                            direct and retroflexion views.                           Two biopsies were taken every 10 cm with a cold                            forceps from the entire colon for ulcerative                            colitis surveillance. These biopsy specimens were  sent to Pathology. Verification of patient                            identification for the specimen was done. Estimated                            blood loss was minimal. Complications:            No immediate complications. Estimated Blood Loss:     Estimated blood loss was minimal. Impression:               - Preparation of the colon was fair.                           - Mucosal ulceration in the rectum and in the                            sigmoid colon. Biopsied. A few areas of slight                            inflammation - biopsied as part of surveillance                            biopsies.                           - External and internal hemorrhoids.                           - The examination was otherwise normal on direct                            and retroflexion views.                           - Biopsies for surveillance were taken from the                             entire colon. Cecum/Ascendin; transverse;                            descending and proximal sigmoid; distal sigmoid and                            rectum Recommendation:           - Patient has a contact number available for                            emergencies. The signs and symptoms of potential                            delayed complications were discussed with the                            patient. Return to normal activities tomorrow.  Written discharge instructions were provided to the                            patient.                           - Resume previous diet.                           - Continue present medications.                           - Await pathology results.                           - Repeat colonoscopy is recommended for                            surveillance. The colonoscopy date will be                            determined after pathology results from today's                            exam become available for review. Use diufferent                            prep. Iva Boop, MD 10/14/2022 9:15:25 AM This report has been signed electronically.

## 2022-10-14 NOTE — Progress Notes (Signed)
Pt's states no medical or surgical changes since previsit or office visit. 

## 2022-10-15 ENCOUNTER — Telehealth: Payer: Self-pay

## 2022-10-15 NOTE — Telephone Encounter (Signed)
  Follow up Call-     10/14/2022    8:05 AM  Call back number  Post procedure Call Back phone  # [phone]  Permission to leave phone message Yes     Patient questions:  Do you have a fever, pain , or abdominal swelling? No. Pain Score  0 *  Have you tolerated food without any problems? Yes.    Have you been able to return to your normal activities? Yes.    Do you have any questions about your discharge instructions: Diet   No. Medications  No. Follow up visit  No.  Do you have questions or concerns about your Care? No.  Actions: * If pain score is 4 or above: No action needed, pain <4.

## 2022-12-02 ENCOUNTER — Encounter: Payer: Commercial Managed Care - PPO | Admitting: Internal Medicine

## 2022-12-24 ENCOUNTER — Encounter: Payer: Self-pay | Admitting: Sports Medicine

## 2022-12-24 ENCOUNTER — Ambulatory Visit: Payer: Commercial Managed Care - PPO | Admitting: Sports Medicine

## 2022-12-24 ENCOUNTER — Other Ambulatory Visit (INDEPENDENT_AMBULATORY_CARE_PROVIDER_SITE_OTHER): Payer: Commercial Managed Care - PPO

## 2022-12-24 DIAGNOSIS — M7632 Iliotibial band syndrome, left leg: Secondary | ICD-10-CM | POA: Diagnosis not present

## 2022-12-24 DIAGNOSIS — G8929 Other chronic pain: Secondary | ICD-10-CM

## 2022-12-24 DIAGNOSIS — M1712 Unilateral primary osteoarthritis, left knee: Secondary | ICD-10-CM | POA: Diagnosis not present

## 2022-12-24 DIAGNOSIS — M25562 Pain in left knee: Secondary | ICD-10-CM | POA: Diagnosis not present

## 2022-12-24 MED ORDER — IBUPROFEN 800 MG PO TABS: 800.0000 mg | ORAL_TABLET | Freq: Three times a day (TID) | ORAL | 0 refills | Status: DC | PRN

## 2022-12-24 MED ORDER — BUPIVACAINE HCL 0.25 % IJ SOLN
2.0000 mL | INTRAMUSCULAR | Status: AC | PRN
Start: 2022-12-24 — End: 2022-12-24
  Administered 2022-12-24: 2 mL via INTRA_ARTICULAR

## 2022-12-24 MED ORDER — LIDOCAINE HCL 1 % IJ SOLN
2.0000 mL | INTRAMUSCULAR | Status: AC | PRN
Start: 2022-12-24 — End: 2022-12-24
  Administered 2022-12-24: 2 mL

## 2022-12-24 MED ORDER — METHYLPREDNISOLONE ACETATE 40 MG/ML IJ SUSP
80.0000 mg | INTRAMUSCULAR | Status: AC | PRN
Start: 2022-12-24 — End: 2022-12-24
  Administered 2022-12-24: 80 mg via INTRA_ARTICULAR

## 2022-12-24 NOTE — Progress Notes (Signed)
Patient says that he has had left knee pain off and on for awhile now. He says that he has more of burning pain, and points along distal IT band when describing his pain. He says he does not remember a specific injury. He says that he has a lot of discomfort laying with it out straight, or sitting with it bent, for longer than 15 minutes at a time.

## 2022-12-24 NOTE — Progress Notes (Signed)
Isaiah Miller - 67 y.o. male MRN [id number]  Date of birth: 1956/02/03  Office Visit Note: Visit Date: 12/24/2022 PCP: Isaiah Sax, MD Referred by: Isaiah Miller,*  Subjective: Chief Complaint  Patient presents with   Left Knee - Pain   HPI: Isaiah Miller is a pleasant 67 y.o. male who presents today for evaluation of chronic left lateral knee pain.  Patient knee has bothered him on and off for a few years now.  He is reporting pain over the lateral aspect of the knee pointing near the IT band.  No inciting event or injury.  Does report stiffness in the knee having difficulty with fully extending the knee as well as sitting with it bent for a longer period of time.  Pain with going from sitting to standing.  He is taking ibuprofen 800 mg as needed for pain control.  Pertinent ROS were reviewed with the patient and found to be negative unless otherwise specified above in HPI.   Assessment & Plan: Visit Diagnoses:  1. Chronic pain of left knee   2. Unilateral primary osteoarthritis, left knee   3. It band syndrome, left    Plan: Discussed with Isaiah Miller the etiology of his acute on chronic left knee pain. Discussed his x-rays do show severe osteoarthritis of the left knee, which I do think are the main contributors to his stiffness and diminished range of motion.  I also think he is having some concomitant distal IT band syndrome as he is tender over this location.  Discussed treating the knee osteoarthritis first to see if this gets rid of his compensatory IT band syndrome. Through shared decision making, we did proceed with intra-articular knee injection, patient tolerated well.  I would like to see his improvement over the next 2-3 weeks, he will notify me of this.  My athletic trainer did give him some IT band stretches for him to perform starting this upcoming week.  If he gets significant relief from the corticosteroid injection, would consider treating for  symptomatic knee OA.  If he only gets mild relief and still having pain over the lateral IT band, with plan to treat this more specifically with extracorporeal shockwave therapy, possible rehab and other topical medications in this location.  Follow-up: Return for 2-3 weeks for re-eval of L-knee (may make appt or call/message with update).   Meds & Orders:  Meds ordered this encounter  Medications   ibuprofen (ADVIL) 800 MG tablet    Sig: Take 1 tablet (800 mg total) by mouth every 8 (eight) hours as needed. Take with food    Dispense:  30 tablet    Refill:  0    Orders Placed This Encounter  Procedures   Large Joint Inj   XR Knee Complete 4 Views Left     Procedures: Large Joint Inj: L knee on 12/24/2022 9:07 AM Indications: pain and diagnostic evaluation Details: 22 G 1.5 in needle, anterolateral approach Medications: 2 mL lidocaine 1 %; 2 mL bupivacaine 0.25 %; 80 mg methylPREDNISolone acetate 40 MG/ML Outcome: tolerated well, no immediate complications  Knee Injection, Left: After discussion on risks/benefits/indications, informed verbal consent was obtained and a timeout was performed, patient was seated on exam table. The patient's knee was prepped with Betadine and alcohol swab and utilizing anterolateral approach, the patient's knee was injected intraarticularly with 2:2:2 lidocaine 1%:bupivicaine 0.25%:depomedrol. Patient tolerated the procedure well without immediate complications.  Procedure, treatment alternatives, risks and benefits explained, specific risks discussed.  Consent was given by the patient. Patient was prepped and draped in the usual sterile fashion.          Clinical History: No specialty comments available.  He reports that he has never smoked. He has never used smokeless tobacco. No results for input(s): "HGBA1C", "LABURIC" in the last 8760 hours.  Objective:    Physical Exam  Gen: Well-appearing, in no acute distress; non-toxic CV: Well-perfused.  Warm.  Resp: Breathing unlabored on room air; no wheezing. Psych: Fluid speech in conversation; appropriate affect; normal thought process Neuro: Sensation intact throughout. No gross coordination deficits.   Ortho Exam - Left knee: Evaluation of the left knee shows a trace effusion on the knee without significant warmth or redness.  There is no medial or lateral joint line TTP.  There is some tenderness over the distal IT band just proximal to Isaiah Miller.  There is limitation in range of motion with the left knee about 5-100 degrees compared to 0-120 degrees of the contralateral knee.  There is some crepitus noted through flexion and extension and pain with endrange flexion. + Mildly positive Noble's compression test, negative Ober's.   Imaging: XR Knee Complete 4 Views Left  Result Date: 12/24/2022 4 view x-ray of the left knee including standing AP, Rosenberg, lateral and sunrise view was ordered and reviewed by myself.  X-rays demonstrate advanced osteoarthritic change of the medial tibiofemoral compartment and the patellofemoral joint.  This is severe on the left and moderate to severe on the right knee.  There is a moderate degree of varus formation about the knee secondary to his arthritis.  No acute fracture noted, likely small joint effusion.   Past Medical/Family/Surgical/Social History: Medications & Allergies reviewed per EMR, new medications updated. Patient Active Problem List   Diagnosis Date Noted   Abnormal urinalysis 10/01/2022   Right-sided low back pain without sciatica 09/29/2022   B12 deficiency 09/29/2022   Rheumatoid factor positive 09/29/2022   Polyneuropathy, peripheral sensorimotor axonal 07/26/2022   Paresthesias 09/24/2021   Elevated LDL cholesterol level 09/24/2021   Left ankle swelling 09/22/2020   Elevated glucose 08/31/2019   Snores 07/06/2019   Viral upper respiratory tract infection 02/20/2018   Primary insomnia 07/29/2017   Screening for  prostate cancer 03/28/2015   Morbid obesity (HCC) 09/19/2010   ED (erectile dysfunction) 09/19/2010   Vitamin D deficiency 01/17/2009   Chronic universal ulcerative colitis (HCC) 07/07/2006   Past Medical History:  Diagnosis Date   GERD (gastroesophageal reflux disease)    Hepatitis    ? EtOH   Ulcerative colitis    dx 1987 left and then more universal   Vitamin D deficiency    Family History  Problem Relation Age of Onset   Diabetes Mother    Colon cancer Neg Hx    Rectal cancer Neg Hx    Stomach cancer Neg Hx    Esophageal cancer Neg Hx    Pancreatic cancer Neg Hx    Colon polyps Neg Hx    Heart disease Neg Hx    Kidney disease Neg Hx    Liver disease Neg Hx    Neuropathy Neg Hx    Past Surgical History:  Procedure Laterality Date   CHOLECYSTECTOMY     COLONOSCOPY W/ BIOPSIES  05/2006, 03/2009   ulcerative colitis left and right, no dysplasia   KNEE SURGERY Right    Social History   Occupational History   Occupation: Patent attorney: [employer]  Tobacco Use  Smoking status: Never   Smokeless tobacco: Never  Vaping Use   Vaping status: Never Used  Substance and Sexual Activity   Alcohol use: Yes    Alcohol/week: 5.0 standard drinks of alcohol    Types: 5 Cans of beer per week    Comment: occ   Drug use: Yes    Types: Marijuana    Comment: some nights to help with sleep   Sexual activity: Yes    Comment: married

## 2022-12-29 ENCOUNTER — Other Ambulatory Visit: Payer: Self-pay | Admitting: Internal Medicine

## 2023-01-25 ENCOUNTER — Ambulatory Visit: Payer: Commercial Managed Care - PPO | Admitting: Internal Medicine

## 2023-03-09 ENCOUNTER — Other Ambulatory Visit: Payer: Self-pay

## 2023-03-09 DIAGNOSIS — E559 Vitamin D deficiency, unspecified: Secondary | ICD-10-CM

## 2023-03-09 NOTE — Telephone Encounter (Signed)
Requesting: VIT D-2 CAP 50,000U  Last Visit: 09/29/18 Next Visit: Visit date not found Last Refill: 10/01/2022  Please Advise    PATIENT IS REQUESTING A 90 DAY SUPPLY

## 2023-03-10 MED ORDER — VITAMIN D (ERGOCALCIFEROL) 1.25 MG (50000 UNIT) PO CAPS
50000.0000 [IU] | ORAL_CAPSULE | ORAL | 1 refills | Status: DC
Start: 2023-03-10 — End: 2023-03-11

## 2023-03-11 ENCOUNTER — Telehealth: Payer: Self-pay

## 2023-03-11 DIAGNOSIS — E559 Vitamin D deficiency, unspecified: Secondary | ICD-10-CM

## 2023-03-11 MED ORDER — VITAMIN D (ERGOCALCIFEROL) 1.25 MG (50000 UNIT) PO CAPS
50000.0000 [IU] | ORAL_CAPSULE | ORAL | 2 refills | Status: DC
Start: 2023-03-11 — End: 2023-12-22

## 2023-03-11 NOTE — Telephone Encounter (Signed)
Requesting: Vit D-2 1.25mg  Last Visit: 09/29/2022 Next Visit: Visit date not found Last Refill: 03-10-23  Please Advise   Patient is requesting a 90 day supply with 3 refills

## 2023-03-28 ENCOUNTER — Other Ambulatory Visit: Payer: Self-pay | Admitting: Sports Medicine

## 2023-03-28 DIAGNOSIS — G5793 Unspecified mononeuropathy of bilateral lower limbs: Secondary | ICD-10-CM

## 2023-03-28 DIAGNOSIS — R202 Paresthesia of skin: Secondary | ICD-10-CM

## 2023-04-01 ENCOUNTER — Other Ambulatory Visit (HOSPITAL_BASED_OUTPATIENT_CLINIC_OR_DEPARTMENT_OTHER): Payer: Self-pay | Admitting: Cardiology

## 2023-04-01 DIAGNOSIS — E7800 Pure hypercholesterolemia, unspecified: Secondary | ICD-10-CM

## 2023-05-06 ENCOUNTER — Encounter: Payer: Self-pay | Admitting: Sports Medicine

## 2023-05-06 ENCOUNTER — Ambulatory Visit: Admitting: Sports Medicine

## 2023-05-06 DIAGNOSIS — M25562 Pain in left knee: Secondary | ICD-10-CM | POA: Diagnosis not present

## 2023-05-06 DIAGNOSIS — M170 Bilateral primary osteoarthritis of knee: Secondary | ICD-10-CM

## 2023-05-06 DIAGNOSIS — G8929 Other chronic pain: Secondary | ICD-10-CM

## 2023-05-06 MED ORDER — IBUPROFEN 800 MG PO TABS: 800.0000 mg | ORAL_TABLET | Freq: Three times a day (TID) | ORAL | 0 refills | Status: DC | PRN

## 2023-05-06 MED ORDER — BUPIVACAINE HCL 0.25 % IJ SOLN
2.0000 mL | INTRAMUSCULAR | Status: AC | PRN
  Administered 2023-05-06: 2 mL via INTRA_ARTICULAR

## 2023-05-06 MED ORDER — METHYLPREDNISOLONE ACETATE 40 MG/ML IJ SUSP
80.0000 mg | INTRAMUSCULAR | Status: AC | PRN
  Administered 2023-05-06: 80 mg via INTRA_ARTICULAR

## 2023-05-06 MED ORDER — LIDOCAINE HCL 1 % IJ SOLN
2.0000 mL | INTRAMUSCULAR | Status: AC | PRN
  Administered 2023-05-06: 2 mL

## 2023-05-06 NOTE — Progress Notes (Signed)
 Patient says that his knee has felt great since his last injection. His pain has flared back up in the last couple of weeks and is especially noticeable when he goes from seated to standing. He says it feels similar to the way it felt before his injection, but not as bad. Patient denies any new injury.

## 2023-05-06 NOTE — Progress Notes (Signed)
 Isaiah Miller - 68 y.o. male MRN [id number]  Date of birth: 03-02-1955  Office Visit Note: Visit Date: 05/06/2023 PCP: Mliss Sax, MD Referred by: Mliss Sax,*  Subjective: Chief Complaint  Patient presents with   Left Knee - Pain   HPI: Isaiah Miller is a pleasant 68 y.o. male who presents today for follow-up of acute on chronic left knee pain with OA.  Back in early November of last year, we did provide a corticosteroid injection into the knee which helped significantly until his pain has started to slowly reexacerbated over the last few weeks.  He notices worse when he goes from sitting to standing.  It continues over the lateral aspect of the knee.  No instability.  He does use ibuprofen 800 mg as needed for pain control.  Pertinent ROS were reviewed with the patient and found to be negative unless otherwise specified above in HPI.   Assessment & Plan: Visit Diagnoses:  1. Chronic pain of left knee   2. Bilateral primary osteoarthritis of knee    Plan: Impression is acute on chronic left knee pain with advanced osteoarthritis.  He received excellent relief of his pain for at least 4 months from prior corticosteroid injection, through shared decision making we did repeat this into the knee joint today.  He may use ice/heat as well as ibuprofen for any postinjection pain.  Will continue being physically active.  As long as he is still receiving good relief from the injections, we can repeat these infrequently. F/u PRN.  Follow-up: Return if symptoms worsen or fail to improve.   Meds & Orders:  Meds ordered this encounter  Medications   ibuprofen (ADVIL) 800 MG tablet    Sig: Take 1 tablet (800 mg total) by mouth every 8 (eight) hours as needed. Take with food    Dispense:  30 tablet    Refill:  0    Orders Placed This Encounter  Procedures   Large Joint Inj: L knee     Procedures: Large Joint Inj: L knee on 05/06/2023 8:42 AM Indications:  pain Details: 22 G 1.5 in needle, anterolateral approach Medications: 2 mL lidocaine 1 %; 2 mL bupivacaine 0.25 %; 80 mg methylPREDNISolone acetate 40 MG/ML Outcome: tolerated well, no immediate complications  Knee Injection, Left: After discussion on risks/benefits/indications, informed verbal consent was obtained and a timeout was performed, patient was seated on exam table. The patient's knee was prepped with Betadine and alcohol swab and utilizing anterolateral approach, the patient's knee was injected intraarticularly with 2:2:2 lidocaine 1%:bupivicaine 0.25%:depomedrol. Patient tolerated the procedure well without immediate complications.  Procedure, treatment alternatives, risks and benefits explained, specific risks discussed. Consent was given by the patient. Patient was prepped and draped in the usual sterile fashion.          Clinical History: No specialty comments available.  He reports that he has never smoked. He has never used smokeless tobacco. No results for input(s): "HGBA1C", "LABURIC" in the last 8760 hours.  Objective:    Physical Exam  Gen: Well-appearing, in no acute distress; non-toxic CV: Well-perfused. Warm.  Resp: Breathing unlabored on room air; no wheezing. Psych: Fluid speech in conversation; appropriate affect; normal thought process  Ortho Exam - Left knee: There is some lateral joint line tenderness.  There is no significant effusion on the knee.  There is a mild varus formation upon standing of bilateral knees.  Range of motion 0-120 degrees.  Imaging:  12/24/22: 4 view  x-ray of the left knee including standing AP, Rosenberg, lateral  and sunrise view was ordered and reviewed by myself.  X-rays demonstrate  advanced osteoarthritic change of the medial tibiofemoral compartment and  the patellofemoral joint.  This is severe on the left and moderate to  severe on the right knee.  There is a moderate degree of varus formation  about the knee secondary  to his arthritis.  No acute fracture noted,  likely small joint effusion.   Past Medical/Family/Surgical/Social History: Medications & Allergies reviewed per EMR, new medications updated. Patient Active Problem List   Diagnosis Date Noted   Abnormal urinalysis 10/01/2022   Right-sided low back pain without sciatica 09/29/2022   B12 deficiency 09/29/2022   Rheumatoid factor positive 09/29/2022   Polyneuropathy, peripheral sensorimotor axonal 07/26/2022   Paresthesias 09/24/2021   Elevated LDL cholesterol level 09/24/2021   Left ankle swelling 09/22/2020   Elevated glucose 08/31/2019   Snores 07/06/2019   Viral upper respiratory tract infection 02/20/2018   Primary insomnia 07/29/2017   Screening for prostate cancer 03/28/2015   Morbid obesity (HCC) 09/19/2010   ED (erectile dysfunction) 09/19/2010   Vitamin D deficiency 01/17/2009   Chronic universal ulcerative colitis (HCC) 07/07/2006   Past Medical History:  Diagnosis Date   GERD (gastroesophageal reflux disease)    Hepatitis    ? EtOH   Ulcerative colitis    dx 1987 left and then more universal   Vitamin D deficiency    Family History  Problem Relation Age of Onset   Diabetes Mother    Colon cancer Neg Hx    Rectal cancer Neg Hx    Stomach cancer Neg Hx    Esophageal cancer Neg Hx    Pancreatic cancer Neg Hx    Colon polyps Neg Hx    Heart disease Neg Hx    Kidney disease Neg Hx    Liver disease Neg Hx    Neuropathy Neg Hx    Past Surgical History:  Procedure Laterality Date   CHOLECYSTECTOMY     COLONOSCOPY W/ BIOPSIES  05/2006, 03/2009   ulcerative colitis left and right, no dysplasia   KNEE SURGERY Right    Social History   Occupational History   Occupation: Patent attorney: [employer]  Tobacco Use   Smoking status: Never   Smokeless tobacco: Never  Vaping Use   Vaping status: Never Used  Substance and Sexual Activity   Alcohol use: Yes    Alcohol/week: 5.0 standard drinks of  alcohol    Types: 5 Cans of beer per week    Comment: occ   Drug use: Yes    Types: Marijuana    Comment: some nights to help with sleep   Sexual activity: Yes    Comment: married

## 2023-08-26 ENCOUNTER — Encounter: Payer: Self-pay | Admitting: Advanced Practice Midwife

## 2023-08-29 ENCOUNTER — Encounter: Payer: Self-pay | Admitting: Sports Medicine

## 2023-08-29 ENCOUNTER — Other Ambulatory Visit: Payer: Self-pay | Admitting: Sports Medicine

## 2023-08-29 DIAGNOSIS — G5793 Unspecified mononeuropathy of bilateral lower limbs: Secondary | ICD-10-CM

## 2023-08-29 DIAGNOSIS — R202 Paresthesia of skin: Secondary | ICD-10-CM

## 2023-08-29 MED ORDER — IBUPROFEN 800 MG PO TABS: 800.0000 mg | ORAL_TABLET | Freq: Three times a day (TID) | ORAL | 1 refills | Status: DC | PRN

## 2023-08-29 MED ORDER — GABAPENTIN 600 MG PO TABS: 600.0000 mg | ORAL_TABLET | Freq: Two times a day (BID) | ORAL | 1 refills | Status: DC

## 2023-08-30 ENCOUNTER — Other Ambulatory Visit: Payer: Self-pay | Admitting: Sports Medicine

## 2023-08-30 DIAGNOSIS — G5793 Unspecified mononeuropathy of bilateral lower limbs: Secondary | ICD-10-CM

## 2023-08-30 DIAGNOSIS — R202 Paresthesia of skin: Secondary | ICD-10-CM

## 2023-08-30 MED ORDER — GABAPENTIN 600 MG PO TABS: 600.0000 mg | ORAL_TABLET | Freq: Two times a day (BID) | ORAL | 1 refills | Status: DC

## 2023-08-30 MED ORDER — IBUPROFEN 800 MG PO TABS: 800.0000 mg | ORAL_TABLET | Freq: Three times a day (TID) | ORAL | 1 refills | Status: DC | PRN

## 2023-08-31 ENCOUNTER — Other Ambulatory Visit: Payer: Self-pay

## 2023-08-31 ENCOUNTER — Encounter: Payer: Self-pay | Admitting: Internal Medicine

## 2023-08-31 MED ORDER — SULFASALAZINE 500 MG PO TABS: ORAL_TABLET | ORAL | 0 refills | Status: DC

## 2023-09-04 ENCOUNTER — Other Ambulatory Visit: Payer: Self-pay | Admitting: Sports Medicine

## 2023-10-24 ENCOUNTER — Other Ambulatory Visit: Payer: Self-pay | Admitting: Internal Medicine

## 2023-10-24 ENCOUNTER — Other Ambulatory Visit: Payer: Self-pay | Admitting: Sports Medicine

## 2023-10-24 DIAGNOSIS — G5793 Unspecified mononeuropathy of bilateral lower limbs: Secondary | ICD-10-CM

## 2023-10-24 DIAGNOSIS — R202 Paresthesia of skin: Secondary | ICD-10-CM

## 2023-11-01 ENCOUNTER — Encounter: Payer: Self-pay | Admitting: Internal Medicine

## 2023-12-12 ENCOUNTER — Encounter: Payer: Self-pay | Admitting: Radiology

## 2023-12-21 NOTE — Progress Notes (Signed)
 Chief Complaint: Ulcerative colitis  HPI:    Mr. Isaiah Miller is a 68 year old male with a past medical history as listed below including ulcerative colitis and reflux, known to Dr. Avram, who presents to clinic today for follow-up of his ulcerative colitis.    05/04/2019 colonoscopy with 1 diminutive polyp in the rectum, erythematous/granular/inflamed and ulcerated mucosa in the rectum and sigmoid colon and otherwise normal.  Colitis is active.  Tried to get him on Lialda  but could not.  Told him need repeat colon in a year.    01/06/2022 patient seen in clinic by Dr. Avram and at that time was clinically in remission and asymptomatic on Sulfasalazine .  Discussed that he had colitis in 2021 that was uncontrolled and was unable to afford Lialda  which was part of the issue, discussed he needed a repeat colonoscopy but he was not ready to schedule.  He was started on folic acid .    04/26/2022 echo with LVEF 55-60%.    08/23/2022 patient called in and described urgent stools with bloating for a week and was requesting Prednisone .  Dr. Avram started him on Prednisone  40 mg daily for 3 days, then 30 mg for 3 days, then 20 mg for 7 days and then 10 mg for 7 days and then 5 mg for 7 days.  Also recommended labs.    08/30/2022 CBC with a white count of 11.5 (on Prednisone ) .    08/31/2022 patient presented with an ulcerative colitis flare.  He was on a prednisone  taper.  Symptoms 90% better.  Continued on Sulfasalazine .  Completed prednisone  taper and scheduled for colonoscopy.    10/14/2022 colonoscopy with only fair preparation of the colon, mucosal ulceration in the rectum and sigmoid colon, few areas of slight inflammation, exteriorly internal hemorrhoids.  Biopsy showed with active chronic colitis.  At that time biopsies confirm mild inflammation and recommended a repeat colonoscopy in 2025.    Today, the patient presents to clinic and tells me he is doing well.  He takes sulfasalazine  500 mg 4 tablets twice a  day and this keeps all of his symptoms at bay.  He describes very occasional diarrhea but thinks this depends on what he eats.  No abdominal pain, no blood in his stool.  Overall feels very well.  He did just have his flu shot on Tuesday.    Denies fever, chills or weight loss.  Past Medical History:  Diagnosis Date   GERD (gastroesophageal reflux disease)    Hepatitis    ? EtOH   Ulcerative colitis    dx 1987 left and then more universal   Vitamin D  deficiency     Past Surgical History:  Procedure Laterality Date   CHOLECYSTECTOMY     COLONOSCOPY W/ BIOPSIES  05/2006, 03/2009   ulcerative colitis left and right, no dysplasia   KNEE SURGERY Right     Current Outpatient Medications  Medication Sig Dispense Refill   atorvastatin  (LIPITOR) 20 MG tablet TAKE 1 TABLET DAILY 90 tablet 3   gabapentin  (NEURONTIN ) 600 MG tablet TAKE 1 TABLET BY MOUTH TWICE  DAILY 200 tablet 2   ibuprofen  (ADVIL ) 800 MG tablet TAKE 1 TABLET BY MOUTH EVERY 8  HOURS AS NEEDED TAKE WITH FOOD 120 tablet 8   sulfaSALAzine  (AZULFIDINE ) 500 MG tablet TAKE 4 TABLETS (2,000 MG TOTAL) TWO TIMES DAILY schedule an appointment for further refills 720 tablet 0   Vitamin D , Ergocalciferol , (DRISDOL ) 1.25 MG (50000 UNIT) CAPS capsule Take 1 capsule (50,000 Units  total) by mouth every 7 (seven) days. 12 capsule 2   No current facility-administered medications for this visit.    Allergies as of 12/22/2023   (No Known Allergies)    Family History  Problem Relation Age of Onset   Diabetes Mother    Colon cancer Neg Hx    Rectal cancer Neg Hx    Stomach cancer Neg Hx    Esophageal cancer Neg Hx    Pancreatic cancer Neg Hx    Colon polyps Neg Hx    Heart disease Neg Hx    Kidney disease Neg Hx    Liver disease Neg Hx    Neuropathy Neg Hx     Social History   Socioeconomic History   Marital status: Married    Spouse name: Not on file   Number of children: 3   Years of education: Not on file   Highest education  level: Not on file  Occupational History   Occupation: Patent Attorney: [employer]  Tobacco Use   Smoking status: Never   Smokeless tobacco: Never  Vaping Use   Vaping status: Never Used  Substance and Sexual Activity   Alcohol use: Yes    Alcohol/week: 5.0 standard drinks of alcohol    Types: 5 Cans of beer per week    Comment: occ   Drug use: Yes    Types: Marijuana    Comment: some nights to help with sleep   Sexual activity: Yes    Comment: married  Other Topics Concern   Not on file  Social History Narrative   Occasional caffeine    Social Drivers of Corporate Investment Banker Strain: Low Risk  (08/15/2018)   Overall Financial Resource Strain (CARDIA)    Difficulty of Paying Living Expenses: Not very hard  Food Insecurity: No Food Insecurity (09/27/2022)   Hunger Vital Sign    Worried About Running Out of Food in the Last Year: Never true    Ran Out of Food in the Last Year: Never true  Transportation Needs: No Transportation Needs (09/27/2022)   PRAPARE - Administrator, Civil Service (Medical): No    Lack of Transportation (Non-Medical): No  Physical Activity: Insufficiently Active (09/27/2022)   Exercise Vital Sign    Days of Exercise per Week: 2 days    Minutes of Exercise per Session: 20 min  Stress: No Stress Concern Present (09/27/2022)   [employer] of Occupational Health - Occupational Stress Questionnaire    Feeling of Stress : Only a little  Social Connections: Unknown (09/27/2022)   Social Connection and Isolation Panel    Frequency of Communication with Friends and Family: Three times a week    Frequency of Social Gatherings with Friends and Family: Twice a week    Attends Religious Services: Patient declined    Database Administrator or Organizations: Not on file    Attends Banker Meetings: Not on file    Marital Status: Married  Intimate Partner Violence: Not At Risk (08/15/2018)   Humiliation, Afraid,  Rape, and Kick questionnaire    Fear of Current or Ex-Partner: No    Emotionally Abused: No    Physically Abused: No    Sexually Abused: No    Review of Systems:    Constitutional: No weight loss, fever or chills Cardiovascular: No chest pain Respiratory: No SOB  Gastrointestinal: See HPI and otherwise negative   Physical Exam:  Vital signs: BP 116/78  Pulse 99   Ht 5' 10 (1.778 m)   Wt 248 lb 8 oz (112.7 kg)   SpO2 92%   BMI 35.66 kg/m    Constitutional:   Pleasant overweight Caucasian male appears to be in NAD, Well developed, Well nourished, alert and cooperative Head:  Normocephalic and atraumatic. Eyes:   PEERL, EOMI. No icterus. Conjunctiva pink. Ears:  Normal auditory acuity. Neck:  Supple Throat: Oral cavity and pharynx without inflammation, swelling or lesion.  Respiratory: Respirations even and unlabored. Lungs clear to auscultation bilaterally.   No wheezes, crackles, or rhonchi.  Cardiovascular: Normal S1, S2. No MRG. Regular rate and rhythm. No peripheral edema, cyanosis or pallor.  Gastrointestinal:  Soft, nondistended, nontender. No rebound or guarding. Normal bowel sounds. No appreciable masses or hepatomegaly. Rectal:  Not performed.  Msk:  Symmetrical without gross deformities. Without edema, no deformity or joint abnormality.  Neurologic:  Alert and  oriented x4;  grossly normal neurologically.  Skin:   Dry and intact without significant lesions or rashes. Psychiatric: Demonstrates good judgement and reason without abnormal affect or behaviors.  RELEVANT LABS AND IMAGING: CBC    Component Value Date/Time   WBC 9.7 09/29/2022 1015   RBC 4.31 09/29/2022 1015   HGB 14.3 09/29/2022 1015   HCT 44.6 09/29/2022 1015   PLT 231.0 09/29/2022 1015   MCV 103.5 (H) 09/29/2022 1015   MCH 32.5 11/30/2013 1228   MCHC 32.1 09/29/2022 1015   RDW 14.3 09/29/2022 1015   LYMPHSABS 2.2 08/30/2022 1551   MONOABS 0.9 08/30/2022 1551   EOSABS 0.0 08/30/2022 1551    BASOSABS 0.0 08/30/2022 1551    CMP     Component Value Date/Time   NA 142 09/29/2022 1015   K 4.6 09/29/2022 1015   CL 106 09/29/2022 1015   CO2 30 09/29/2022 1015   GLUCOSE 94 09/29/2022 1015   BUN 24 (H) 09/29/2022 1015   CREATININE 0.80 09/29/2022 1015   CALCIUM  9.3 09/29/2022 1015   PROT 6.7 09/29/2022 1015   PROT 6.6 06/03/2022 0913   ALBUMIN 4.4 09/29/2022 1015   ALBUMIN 4.2 05/28/2022 0834   AST 21 09/29/2022 1015   ALT 28 09/29/2022 1015   ALKPHOS 62 09/29/2022 1015   BILITOT 0.6 09/29/2022 1015   BILITOT 0.4 05/28/2022 0834   GFRNONAA >90 11/30/2013 1228   GFRAA >90 11/30/2013 1228    Assessment: 1.  Ulcerative colitis: Maintained on Sulfasalazine  2000 mg twice daily, currently doing well with no flares, last colonoscopy in September 2024 with repeat recommended a year given some inflammation and fair bowel prep  Plan: 1.  Labs today including CBC, CMP, iron studies, B12 and vitamin D , ESR and CRP as well as fecal calprotectin 2.  Patient is due for repeat colonoscopy.  Scheduled this with Dr. Avram in the [facility].  Did provide the patient with a detailed list of risk for the procedure and he agrees to proceed. Patient is appropriate for endoscopic procedure(s) in the ambulatory ([facility]) setting.   Patient will have a 2-day bowel prep given fair prep at last procedure. 3.  Patient just had his flu vaccine.  Discussed about the Pneumovax and Shingrix, he will discuss with his PCP 3.  Patient to follow in clinic per recommendations after time of procedure.  Isaiah Failing, PA-C [place] 12/21/2023, 8:44 AM  Cc: Berneta Elsie Sim DEWAINE

## 2023-12-22 ENCOUNTER — Other Ambulatory Visit: Payer: Self-pay | Admitting: *Deleted

## 2023-12-22 ENCOUNTER — Other Ambulatory Visit (INDEPENDENT_AMBULATORY_CARE_PROVIDER_SITE_OTHER)

## 2023-12-22 ENCOUNTER — Ambulatory Visit: Payer: Self-pay | Admitting: Physician Assistant

## 2023-12-22 ENCOUNTER — Encounter: Payer: Self-pay | Admitting: Physician Assistant

## 2023-12-22 VITALS — BP 116/78 | HR 99 | Ht 70.0 in | Wt 248.5 lb

## 2023-12-22 DIAGNOSIS — K5100 Ulcerative (chronic) pancolitis without complications: Secondary | ICD-10-CM | POA: Diagnosis not present

## 2023-12-22 LAB — SEDIMENTATION RATE: Sed Rate: 7 mm/h (ref 0–20)

## 2023-12-22 LAB — COMPREHENSIVE METABOLIC PANEL WITH GFR
ALT: 19 U/L (ref 0–53)
AST: 21 U/L (ref 0–37)
Albumin: 4.4 g/dL (ref 3.5–5.2)
Alkaline Phosphatase: 68 U/L (ref 39–117)
BUN: 17 mg/dL (ref 6–23)
CO2: 25 meq/L (ref 19–32)
Calcium: 9.5 mg/dL (ref 8.4–10.5)
Chloride: 105 meq/L (ref 96–112)
Creatinine, Ser: 1.32 mg/dL (ref 0.40–1.50)
GFR: 55.87 mL/min — ABNORMAL LOW (ref >60.00)
Glucose, Bld: 92 mg/dL (ref 70–99)
Potassium: 4 meq/L (ref 3.5–5.1)
Sodium: 139 meq/L (ref 135–145)
Total Bilirubin: 0.5 mg/dL (ref 0.2–1.2)
Total Protein: 7 g/dL (ref 6.0–8.3)

## 2023-12-22 LAB — B12 AND FOLATE PANEL
Folate: 7 ng/mL (ref >5.9)
Vitamin B-12: 370 pg/mL (ref 211–911)

## 2023-12-22 LAB — IBC + FERRITIN
Ferritin: 93.5 ng/mL (ref 22.0–322.0)
Iron: 103 ug/dL (ref 42–165)
Saturation Ratios: 31.6 % (ref 20.0–50.0)
TIBC: 326.2 ug/dL (ref 250.0–450.0)
Transferrin: 233 mg/dL (ref 212.0–360.0)

## 2023-12-22 LAB — CBC WITH DIFFERENTIAL/PLATELET
Basophils Absolute: 0 K/uL (ref 0.0–0.1)
Basophils Relative: 0.3 % (ref 0.0–3.0)
Eosinophils Absolute: 0.1 K/uL (ref 0.0–0.7)
Eosinophils Relative: 1.4 % (ref 0.0–5.0)
HCT: 45.7 % (ref 39.0–52.0)
Hemoglobin: 15.1 g/dL (ref 13.0–17.0)
Lymphocytes Relative: 31.3 % (ref 12.0–46.0)
Lymphs Abs: 2.5 K/uL (ref 0.7–4.0)
MCHC: 33.1 g/dL (ref 30.0–36.0)
MCV: 99.7 fl (ref 78.0–100.0)
Monocytes Absolute: 0.8 K/uL (ref 0.1–1.0)
Monocytes Relative: 10.5 % (ref 3.0–12.0)
Neutro Abs: 4.5 K/uL (ref 1.4–7.7)
Neutrophils Relative %: 56.5 % (ref 43.0–77.0)
Platelets: 213 K/uL (ref 150.0–400.0)
RBC: 4.58 Mil/uL (ref 4.22–5.81)
RDW: 13.8 % (ref 11.5–15.5)
WBC: 8 K/uL (ref 4.0–10.5)

## 2023-12-22 LAB — VITAMIN D 25 HYDROXY (VIT D DEFICIENCY, FRACTURES): VITD: 21.08 ng/mL — ABNORMAL LOW (ref 30.00–100.00)

## 2023-12-22 LAB — C-REACTIVE PROTEIN: CRP: <0.5 mg/dL (ref 0.5–20.0)

## 2023-12-22 MED ORDER — SULFASALAZINE 500 MG PO TABS: 2000.0000 mg | ORAL_TABLET | Freq: Two times a day (BID) | ORAL | 3 refills | Status: AC

## 2023-12-22 MED ORDER — NA SULFATE-K SULFATE-MG SULF 17.5-3.13-1.6 GM/177ML PO SOLN: 1.0000 | Freq: Once | ORAL | 0 refills | Status: AC

## 2023-12-22 NOTE — Patient Instructions (Addendum)
 Your provider has requested that you go to the basement level for lab work before leaving today. Press B on the elevator. The lab is located at the first door on the left as you exit the elevator.  We have sent the following medications to your pharmacy for you to pick up at your convenience: Continue Sulfasalazine .   You have been scheduled for a colonoscopy. Please follow written instructions given to you at your visit today.   If you use inhalers (even only as needed), please bring them with you on the day of your procedure.  DO NOT TAKE 7 DAYS PRIOR TO TEST- Trulicity (dulaglutide) Ozempic, Wegovy (semaglutide) Mounjaro, Zepbound (tirzepatide) Bydureon Bcise (exanatide extended release)  DO NOT TAKE 1 DAY PRIOR TO YOUR TEST Rybelsus (semaglutide) Adlyxin (lixisenatide) Victoza (liraglutide) Byetta (exanatide) ___________________________________________________________________________

## 2024-02-22 ENCOUNTER — Encounter: Admitting: Internal Medicine
